# Patient Record
Sex: Male | Born: 2000 | Race: White | Hispanic: No | Smoking: Never smoker
Health system: Southern US, Community
[De-identification: ages and names within clinical notes are randomized; demographics above are authoritative.]

## PROBLEM LIST (undated history)

## (undated) DIAGNOSIS — J45909 Unspecified asthma, uncomplicated: Secondary | ICD-10-CM

## (undated) HISTORY — DX: Unspecified asthma, uncomplicated: J45.909

---
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
## 2021-02-19 ENCOUNTER — Encounter: Admit: 2021-02-19 | Payer: PRIVATE HEALTH INSURANCE | Attending: Pediatrics | Primary: Pediatrics

## 2021-02-19 ENCOUNTER — Telehealth: Admit: 2021-02-19 | Payer: PRIVATE HEALTH INSURANCE | Primary: Pediatrics

## 2021-02-19 DIAGNOSIS — Q5522 Retractile testis: Secondary | ICD-10-CM

## 2021-02-19 NOTE — Telephone Encounter
[facility] MESSAGETime of call:   11:33 AMCaller:   NickiCaller's relationship to patient:  n/a  Calling from [facility], hospital, agency, etc.):  Dr's office    Reason for call:   Clair Gulling called in on behalf of the patient ( From Dr Sherald Barge office)   Dr is looking for Brendolyn Patty to give a call to office to discuss   What would be a new  Patient  To the office  . Pt has Ascending testicle and is leaving to go back to school  On Friday. If not feeling well, what are symptoms:  n/a   If having symptoms, how long have the symptoms been present:  n/a   Does caller request to speak to someone urgently?  no   Best telephone number for callback:   [phone] Best time to return call:   any Permission to leave message:  no   [facility]

## 2021-02-19 NOTE — Telephone Encounter
Spoke with Dr. Sherald Barge office. Andrew Holder is not in pain but wants to be evaluated. Offered appointment tomorrow, which they accepted. Message to our scheduling team to touch base with the patient, though Dr. Sherald Barge office is also.

## 2021-02-20 ENCOUNTER — Inpatient Hospital Stay: Admit: 2021-02-20 | Discharge: 2021-02-20 | Payer: PRIVATE HEALTH INSURANCE | Primary: Pediatrics

## 2021-02-20 ENCOUNTER — Encounter: Admit: 2021-02-20 | Payer: PRIVATE HEALTH INSURANCE | Attending: Pediatrics | Primary: Pediatrics

## 2021-02-20 ENCOUNTER — Ambulatory Visit: Admit: 2021-02-20 | Payer: PRIVATE HEALTH INSURANCE | Attending: Pediatrics | Primary: Pediatrics

## 2021-02-20 DIAGNOSIS — N3644 Muscular disorders of urethra: Secondary | ICD-10-CM

## 2021-02-20 DIAGNOSIS — Q5522 Retractile testis: Secondary | ICD-10-CM

## 2021-02-20 DIAGNOSIS — N50811 Right testicular pain: Secondary | ICD-10-CM

## 2021-02-20 MED ORDER — IBUPROFEN 200 MG TABLET
200 mg | Freq: Four times a day (QID) | ORAL | Status: AC | PRN
Start: 2021-02-20 — End: ?

## 2021-02-20 MED ORDER — TAMSULOSIN 0.4 MG CAPSULE
0.4 mg | ORAL_CAPSULE | Freq: Every day | ORAL | 3 refills | Status: AC
Start: 2021-02-20 — End: ?

## 2021-02-20 NOTE — Progress Notes
Thank you for allowing me to offer my recommendations on Andrew Holder regarding his history of issues with his testicles.  Andrew Holder reports he has longstanding intermittent pain in his testicles.  He says that he has been evaluated multiple times for exacerbation and suspicion of torsion.  Torsion has always been ruled out.  He reports that he will sometimes have the pain when he is just sitting watching television.  He will sometimes have testicle pain if he is coughing hard he has also noted it when he vomits.  It does not seem to be related to any activity or correlated to urinating.  Of concern he became sexually active in August and has noted that his right testicle specifically will ascending to the inguinal area he has to push it into the scrotum this has happened 5 to 6 times.  He has no pain associated with these episodes but he is uncomfortable with it and does not like the way it feels.Intermittently he will have a start/stop urinary stream.  He reports at times he will have significant urgency to urinate but difficulty oral will be completely unable to start his flow.  This does seem exacerbated if he drinks alcohol.  There is no pain with urination.  There is no history of urinary tract infection. Bowel movements are reportedly daily stool is soft.  He has in the past had issues with diarrhea he was seen and evaluated by Pediatric Gastroenterology at [facility] and diagnosed with IBS.  They felt it was stress related.  . I had the pleasure of meeting Andrew Holder along with his parents as a new office consultation in our pediatric urologic faculty practice. I interviewed Andrew Holder's parents and reviewed the intake form with them which documents the full past medical, past surgical, family, and social history as well as the review of systems.  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  Objective:Temp 97.8 ?F (36.6 ?C) (Temporal)  - Ht 6' 4.46 (1.942 m)  - Wt 72.2 kg  - BMI 19.14 kg/m? Physical Exam:Appearance: Healthy appearing young man who is awake, alert and interactive.Head: Normocephalic atraumatic, eyes are anicteric, face is symmetric.Respiratory: Breathing comfortably and in no acute distress.Cardiac: Deferred.Abdomen: The abdomen is soft, non-tender, non distended and there are no palpable masses in all four quadrants.Genitalia: He is circumcised and the meatus is in the orthotopic location. He is Tanner stage 5. The testicles are Descended bilaterally in good position normal to palpation without pain or tenderness.  There is a right epididymal cyst noted on exam.. When in the standing position he had mild fullness in the right hemiscrotum.  I could not elicit a true hernia.Back: Spine is palpably normal and there are no sacral anomalies.Musculoskeletal: He is walking without difficulty, moving all four extremities and good muscle tone.Skin: The skin is warm, dry and evenly pigmented.Uroflow EMG today was done in the office today there is an interrupted plateau void.  Strains with his abdomen to begin his flow pelvic floor is quiet.  Small postvoid residual noted.Impression and plan:In summary, Andrew Holder is a 20 y.o. 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  We discussed that the uroflow EMG findings today are consistent with primary bladder neck dysfunction and have recommended he begin an alpha-blocker.  This was explained in great detail to he and his parents.In regards  to his exam the right testes is by history retract bill.  We discussed that it is unlikely there is a significant hernia but did order an ultrasound.  That ultrasound was done reviewed and found to be normal.  We have recommended a right septal pexy is done electively when he returns from college to address the retractile testes... Thank you again for allowing me to participate in Andrew Holder's care. Please do not hesitate to contact me with questions or concerns. Sincerely,Winfred Iiams Darolyn Rua, APRNParents/Guardian confirmed understanding of instructions and plan of care.

## 2021-02-27 ENCOUNTER — Telehealth: Admit: 2021-02-27 | Payer: PRIVATE HEALTH INSURANCE | Attending: Pediatric Urology | Primary: Pediatrics

## 2021-02-27 NOTE — Telephone Encounter
Spoke to mom and wants to wait until he returns from school and see doctor in May and then will consider calling back to book procedure if needs to

## 2021-03-03 ENCOUNTER — Emergency Department: Payer: 59

## 2021-03-03 ENCOUNTER — Emergency Department
Admission: EM | Admit: 2021-03-03 | Discharge: 2021-03-03 | Disposition: A | Discharge status: Home / Self Care | Payer: 59 | Attending: Emergency Medicine | Admitting: Emergency Medicine

## 2021-03-03 ENCOUNTER — Other Ambulatory Visit: Payer: Self-pay

## 2021-03-03 DIAGNOSIS — L539 Erythematous condition, unspecified: Secondary | ICD-10-CM | POA: Insufficient documentation

## 2021-03-03 DIAGNOSIS — Z20822 Contact with and (suspected) exposure to covid-19: Secondary | ICD-10-CM | POA: Insufficient documentation

## 2021-03-03 DIAGNOSIS — B349 Viral infection, unspecified: Secondary | ICD-10-CM

## 2021-03-03 DIAGNOSIS — R000 Tachycardia, unspecified: Secondary | ICD-10-CM | POA: Diagnosis not present

## 2021-03-03 DIAGNOSIS — R509 Fever, unspecified: Secondary | ICD-10-CM | POA: Diagnosis present

## 2021-03-03 LAB — POC SARS CORONAVIRUS 2 AG -  ED: SARS Coronavirus 2 Ag: NEGATIVE

## 2021-03-03 LAB — CBC
HCT: 44.9 % (ref 39.0–52.0)
Hemoglobin: 15.2 g/dL (ref 13.0–17.0)
MCH: 30.2 pg (ref 26.0–34.0)
MCHC: 33.9 g/dL (ref 30.0–36.0)
MCV: 89.3 fL (ref 80.0–100.0)
Platelets: 227 10*3/uL (ref 150–400)
RBC: 5.03 MIL/uL (ref 4.22–5.81)
RDW: 12.5 % (ref 11.5–15.5)
WBC: 9.9 10*3/uL (ref 4.0–10.5)
nRBC: 0 % (ref 0.0–0.2)

## 2021-03-03 LAB — RESP PANEL BY RT-PCR (FLU A&B, COVID) ARPGX2
Influenza A by PCR: NEGATIVE
Influenza B by PCR: NEGATIVE
SARS Coronavirus 2 by RT PCR: NEGATIVE

## 2021-03-03 LAB — HEPATIC FUNCTION PANEL
ALT: 14 U/L (ref 0–44)
AST: 24 U/L (ref 15–41)
Albumin: 4.7 g/dL (ref 3.5–5.0)
Alkaline Phosphatase: 61 U/L (ref 38–126)
Bilirubin, Direct: 0.2 mg/dL (ref 0.0–0.2)
Indirect Bilirubin: 0.9 mg/dL (ref 0.3–0.9)
Total Bilirubin: 1.1 mg/dL (ref 0.3–1.2)
Total Protein: 8.1 g/dL (ref 6.5–8.1)

## 2021-03-03 LAB — URINALYSIS, COMPLETE (UACMP) WITH MICROSCOPIC
Bacteria, UA: NONE SEEN
Bilirubin Urine: NEGATIVE
Glucose, UA: NEGATIVE mg/dL
Hgb urine dipstick: NEGATIVE
Ketones, ur: NEGATIVE mg/dL
Leukocytes,Ua: NEGATIVE
Nitrite: NEGATIVE
Protein, ur: NEGATIVE mg/dL
Specific Gravity, Urine: 1.02 (ref 1.005–1.030)
Squamous Epithelial / HPF: NONE SEEN (ref 0–5)
pH: 5 (ref 5.0–8.0)

## 2021-03-03 LAB — BASIC METABOLIC PANEL
Anion gap: 9 (ref 5–15)
BUN: 11 mg/dL (ref 6–20)
CO2: 22 mmol/L (ref 22–32)
Calcium: 9.1 mg/dL (ref 8.9–10.3)
Chloride: 101 mmol/L (ref 98–111)
Creatinine, Ser: 1.1 mg/dL (ref 0.61–1.24)
GFR, Estimated: >60 mL/min (ref >60)
Glucose, Bld: 118 mg/dL — ABNORMAL HIGH (ref 70–99)
Potassium: 4.4 mmol/L (ref 3.5–5.1)
Sodium: 132 mmol/L — ABNORMAL LOW (ref 135–145)

## 2021-03-03 LAB — MONONUCLEOSIS SCREEN: Mono Screen: NEGATIVE

## 2021-03-03 LAB — LACTIC ACID, PLASMA: Lactic Acid, Venous: 1 mmol/L (ref 0.5–1.9)

## 2021-03-03 LAB — GROUP A STREP BY PCR: Group A Strep by PCR: NOT DETECTED

## 2021-03-03 LAB — CBG MONITORING, ED: Glucose-Capillary: 107 mg/dL — ABNORMAL HIGH (ref 70–99)

## 2021-03-03 MED ORDER — IBUPROFEN 600 MG PO TABS
600.0000 mg | ORAL_TABLET | Freq: Once | ORAL | Status: AC
  Administered 2021-03-03: 600 mg via ORAL
  Filled 2021-03-03: qty 1

## 2021-03-03 MED ORDER — ACETAMINOPHEN 500 MG PO TABS
1000.0000 mg | ORAL_TABLET | Freq: Once | ORAL | Status: DC
  Filled 2021-03-03: qty 2

## 2021-03-03 MED ORDER — LACTATED RINGERS IV BOLUS
1000.0000 mL | Freq: Once | INTRAVENOUS | Status: AC
  Administered 2021-03-03: 1000 mL via INTRAVENOUS

## 2021-03-03 NOTE — ED Notes (Signed)
EDP @ bedside 

## 2021-03-03 NOTE — Discharge Instructions (Signed)
As we discussed, your work-up today was quite reassuring.  We did not identify a specific cause of your fever, but given your recent positive test for mononucleosis, you are most likely still suffering from symptoms of the mono.  Please continue to avoid strenuous athletic activity, drink plenty of fluids, use over-the-counter ibuprofen according to label instructions for pain or fever.  It is important you avoid any contact sports as it can lead to splenic rupture.  Follow-up with [facility] student health or with your own doctor at the next available opportunity.  Return to the emergency department if you develop new or worsening symptoms that concern you.

## 2021-03-03 NOTE — ED Notes (Signed)
ED Provider at bedside. 

## 2021-03-03 NOTE — ED Notes (Signed)
Patient given snack and water.

## 2021-03-03 NOTE — ED Notes (Signed)
Informed patient that per EDP PCP needs to request patient's chart after patient asked if his chart could be forwarded to his PCP.

## 2021-03-03 NOTE — ED Triage Notes (Signed)
Pt arrives to ED from home with c/c of dizziness and fever beginning at approx 11AM yesterday. Pt states he was seen 2 weeks ago by his primary care and told he had mono. Pt was given azithromycin and and albuterol inhaler. Pt A&Ox4, NAD, ambulatory. Pt states he is nauseated but denies vomiting or diarrhea.

## 2021-03-03 NOTE — ED Notes (Signed)
EDP informed that patient and parents are requesting to see him.

## 2021-03-03 NOTE — ED Notes (Signed)
Patient in stretcher on phone with parents.  Parents requested update, patient provided permission to do so. Parents updated that we are awaiting test results before a plan is developed for patient. Parents and patient state understanding.

## 2021-03-03 NOTE — ED Provider Notes (Signed)
[facility] Emergency Department Provider Note  ____________________________________________   Event Date/Time   First MD Initiated Contact with Patient 03/03/21 [id number]     (approximate)  I have reviewed the triage vital signs and the nursing notes.   HISTORY  Chief Complaint Dizziness and Fever    HPI Steve Myers is a 20 y.o. male who is generally healthy and athletic with no chronic illnesses but who was diagnosed a couple weeks ago with mononucleosis.  He presents today for evaluation of fever and lightheadedness/dizziness.  He says that he really did not have any symptoms but when he went home for spring break a couple of weeks ago he was told he had mono, and his doctor told him that she thinks he had had mono for 4 to 6 weeks.  He is not certain how she knew this or why she would say that long because he has not had symptoms for that long.  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  However he said that this morning he went for a hike which he admits he probably should not have done.  When he got back he was extremely tired and lightheaded and dizzy.  He felt little bit nauseated earlier today but no longer.  When he woke up from a nap he felt hot and checked his temperature and found that he had a fever of around 102.  He came into the ED for additional evaluation.  He also reports that he has a sore throat but that that has only really started within the last 24 hours.  He is not having any difficulty speaking or swallowing.  He has no neck pain or stiffness.  No swelling of the neck or the throat of which he is aware.  He denies chest pain, shortness of breath, cough, vomiting, abdominal pain, and dysuria.  He describes his symptoms as moderate and nothing in particular seems to make it better or worse.         History reviewed. No pertinent past medical  history.  There are no problems to display for this patient.   History reviewed. No pertinent surgical history.  Prior to Admission medications   Not on File    Allergies Patient has no allergy information on record.  History reviewed. No pertinent family history.  Social History Social History   Tobacco Use  . Smoking status: Never Smoker  . Smokeless tobacco: Never Used  Vaping Use  . Vaping Use: Former  Substance Use Topics  . Alcohol use: Yes  . Drug use: Yes    Types: Marijuana    Review of Systems Constitutional: Positive for fever. Eyes: No visual changes. ENT: Positive for sore throat. Cardiovascular: Denies chest pain.  Lightheaded and dizzy earlier today. Respiratory: Denies shortness of breath.  Denies cough. Gastrointestinal: No abdominal pain.  Nausea earlier but now resolved, no vomiting.  No diarrhea.  No constipation. Genitourinary: Negative for dysuria. Musculoskeletal: Negative for neck pain.  Negative for back pain. Integumentary: Negative for rash. Neurological: Negative for headaches, focal weakness or numbness.   ____________________________________________   PHYSICAL EXAM:  VITAL SIGNS: ED Triage Vitals  Enc Vitals Group     BP 03/03/21 0102 119/74     Pulse Rate 03/03/21 0102 (!) 106     Resp 03/03/21 0102 16     Temp 03/03/21 0102 (!) 101.1 F (  38.4 C)     Temp Source 03/03/21 0102 Oral     SpO2 03/03/21 0102 98 %     Weight 03/03/21 0103 73.5 kg (162 lb)     Height 03/03/21 0103 1.956 m (6\' 5" )     Head Circumference --      Peak Flow --      Pain Score 03/03/21 0102 6     Pain Loc --      Pain Edu? --      Excl. in GC? --     Constitutional: Alert and oriented.  Well-appearing and in no distress. Eyes: Conjunctivae are normal.  Head: Atraumatic. Nose: No congestion/rhinnorhea. Mouth/Throat: Oropharynx is notable for some erythema in the posterior pharynx and a little bit of exudate which is most notable on the right  tonsil but there is no significant tonsillar hypertrophy or edema in the back of the throat.  No evidence of dental abscess or Ludwig's angina.  No evidence of peritonsillar abscess. Neck: No stridor.  No meningeal signs.  No tenderness to palpation submandibularly nor tenderness to external manipulation of the larynx. Cardiovascular: Mild tachycardia, regular rhythm. Good peripheral circulation. Respiratory: Normal respiratory effort.  No retractions. Gastrointestinal: Soft and nontender. No distention.  Musculoskeletal: No lower extremity tenderness nor edema. No gross deformities of extremities. Neurologic:  Normal speech and language. No gross focal neurologic deficits are appreciated.  Skin:  Skin is warm, dry and intact. Psychiatric: Mood and affect are normal. Speech and behavior are normal.  ____________________________________________   LABS (all labs ordered are listed, but only abnormal results are displayed)  Labs Reviewed  BASIC METABOLIC PANEL - Abnormal; Notable for the following components:      Result Value   Sodium 132 (*)    Glucose, Bld 118 (*)    All other components within normal limits  URINALYSIS, COMPLETE (UACMP) WITH MICROSCOPIC - Abnormal; Notable for the following components:   Color, Urine YELLOW (*)    APPearance CLEAR (*)    All other components within normal limits  CBG MONITORING, ED - Abnormal; Notable for the following components:   Glucose-Capillary 107 (*)    All other components within normal limits  RESP PANEL BY RT-PCR (FLU A&B, COVID) ARPGX2  CULTURE, BLOOD (SINGLE)  GROUP A STREP BY PCR  CBC  MONONUCLEOSIS SCREEN  LACTIC ACID, PLASMA  HEPATIC FUNCTION PANEL  POC SARS CORONAVIRUS 2 AG -  ED   ____________________________________________  EKG  ED ECG REPORT I, 03/05/21, the attending physician, personally viewed and interpreted this ECG.  Date: 03/03/2021 EKG Time: 1:04 AM Rate: 105 Rhythm: Mild sinus tachycardia QRS Axis:  Right axis deviation Intervals: normal ST/T Wave abnormalities: Non-specific ST segment / T-wave changes, but no clear evidence of acute ischemia. Narrative Interpretation: no definitive evidence of acute ischemia; does not meet STEMI criteria.   ____________________________________________  RADIOLOGY I, 03/05/2021, personally viewed and evaluated these images (plain radiographs) as part of my medical decision making, as well as reviewing the written report by the radiologist.  ED MD interpretation: No acute abnormalities on chest x-ray  Official radiology report(s): DG Chest Port 1 View  Result Date: 03/03/2021 CLINICAL DATA:  Possible sepsis EXAM: PORTABLE CHEST 1 VIEW COMPARISON:  None. FINDINGS: Cardiac shadow is within normal limits. The lungs are well aerated bilaterally. No focal infiltrate or sizable effusion is seen. No bony abnormality is noted. IMPRESSION: No active disease. Electronically Signed   By: 03/05/2021 M.D.   On: 03/03/2021  02:39    ____________________________________________   PROCEDURES   Procedure(s) performed (including Critical Care):  Procedures   ____________________________________________   INITIAL IMPRESSION / MDM / ASSESSMENT AND PLAN / ED COURSE  As part of my medical decision making, I reviewed the following data within the electronic MEDICAL RECORD NUMBER History obtained from family, Nursing notes reviewed and incorporated, Labs reviewed , Old chart reviewed, Radiograph reviewed  and Notes from prior ED visits   Differential diagnosis includes, but is not limited to, mononucleosis, influenza, COVID-19, pneumonia, UTI, peritonsillar abscess, strep throat.  The patient is on the cardiac monitor to evaluate for evidence of arrhythmia and/or significant heart rate changes.  Patient may be somewhat volume depleted given that he seems to have an acute illness and went hiking today.  I am giving him LR 1 L IV bolus.  Chest x-ray and labs are  pending.  15-minute Covid rapid antigen test was negative and I am sending the viral respiratory panel that should be back in 2 hours that will include influenza testing.  Chest x-ray pending, lactic acid pending.  Basic metabolic panel is essentially normal, CBC is normal with no leukocytosis.  Urinalysis is negative for any sign of infection.  Hepatic function panel is pending as is mononucleosis screen.  I sent 1 blood culture which is pending.  Patient's heart rate is improved down to about 90.       Clinical Course as of 03/03/21 0757  Wynelle Link Mar 03, 2021  0313 [facility] Chest Kief 1 View I personally reviewed the patient's imaging and agree with the radiologist's interpretation that there is no evidence of any acute intrathoracic abnormality. [CF]  0344 Lactic Acid, Venous: 1.0 [CF]  0428 SARS Coronavirus 2 by RT PCR: NEGATIVE [CF]  0428 Influenza A By PCR: NEGATIVE [CF]  0428 Influenza B By PCR: NEGATIVE [CF]  0428 Mono Screen: NEGATIVE I suspect the negative results on the mono screen reflects the duration of the symptoms.  If he tested positive for mononucleosis 2 weeks ago, it is likely that the Monospot would be negative tonight even though he still remains somewhat symptomatic.  Strep test pending.  I updated the patient with his results and I will let him know about the strep results once they are back.  He is comfortable with the plan for discharge and outpatient follow-up given all of his other reassuring results. [CF]  0555 Strep test is negative.  I updated the patient.  He feels well and is ready to go.  With his permission and encouragement I spoke over the phone with his mother and gave her a full update.  She is also comfortable with the results.  I will discharge for outpatient follow-up at [facility] but the plan is also for the patient to go home for a while while he recuperates and I think that is appropriate as well.  I gave my usual and customary return precautions. [CF]     Clinical Course User Index [CF] Loleta Rose, MD     ____________________________________________  FINAL CLINICAL IMPRESSION(S) / ED DIAGNOSES  Final diagnoses:  Viral illness  Fever, unspecified fever cause     MEDICATIONS GIVEN DURING THIS VISIT:  Medications  lactated ringers bolus 1,000 mL (0 mLs Intravenous Stopped 03/03/21 0347)  ibuprofen (ADVIL) tablet 600 mg (600 mg Oral Given 03/03/21 0346)     ED Discharge Orders    None      *Please note:  Steve Myers was evaluated in  Emergency Department on 03/03/2021 for the symptoms described in the history of present illness. He was evaluated in the context of the global COVID-19 pandemic, which necessitated consideration that the patient might be at risk for infection with the SARS-CoV-2 virus that causes COVID-19. Institutional protocols and algorithms that pertain to the evaluation of patients at risk for COVID-19 are in a state of rapid change based on information released by regulatory bodies including the CDC and federal and state organizations. These policies and algorithms were followed during the patient's care in the ED.  Some ED evaluations and interventions may be delayed as a result of limited staffing during and after the pandemic.*  Note:  This document was prepared using Dragon voice recognition software and may include unintentional dictation errors.   Loleta RoseForbach, Cory, MD 03/03/21 [phone]

## 2021-03-03 NOTE — ED Notes (Addendum)
Patients parents calling this RN on ascom for update. Update had been previously given at time of assessment, no changes in patient since last notified. Patient and parents informed that EDP will evaluate patient as soon as possible, patients father increasingly frustrated with this RN, raising voice over phone at this RN. RN attempted to inform parents that patient is in NAD, and that EDP will be in to see patient. Parents state understanding and are concerned with patient not being able to get ride home.   Patient in NAD. Permission given from patient to update parents.

## 2021-03-08 LAB — CULTURE, BLOOD (SINGLE)
Culture: NO GROWTH
Special Requests: ADEQUATE

## 2022-07-30 ENCOUNTER — Encounter: Payer: Self-pay | Admitting: Adult Health

## 2022-07-30 ENCOUNTER — Ambulatory Visit (INDEPENDENT_AMBULATORY_CARE_PROVIDER_SITE_OTHER): Payer: 59 | Admitting: Adult Health

## 2022-07-30 ENCOUNTER — Other Ambulatory Visit: Payer: Self-pay

## 2022-07-30 VITALS — BP 114/70 | HR 95 | Temp 98.3°F | Ht 77.0 in | Wt 165.0 lb

## 2022-07-30 DIAGNOSIS — M25561 Pain in right knee: Secondary | ICD-10-CM | POA: Diagnosis not present

## 2022-07-30 NOTE — Progress Notes (Signed)
[facility] [address] [place], [place] [address] Phone: [phone] Fax: [phone]   Office Visit Note  Patient Name: Steve Myers [facility]  Date of Birth:24-Apr-2001  Med Rec number [id number]  Date of Service: 07/30/2022  Steve Myers (malabar nut tree) [justicia adhatoda] and Peanut-containing drug products  Chief Complaint  Patient presents with   Pain    In Right leg     HPI  Patient reports he was working out at [employer], doing leg press.  He had instant pain after doing leg press (360lbs). Later in the shower he noticed something was "out of place on his posterior right knee, he "pushed it back in".  This happened twice, and today he is sore, and has some anterior knee pain.  He took 6 advil and 2 tylenol, with some relief.   Current Medication:  Outpatient Encounter Medications as of 07/30/2022  Medication Sig   albuterol (VENTOLIN HFA) 108 (90 Base) MCG/ACT inhaler Inhale into the lungs every 6 (six) hours as needed for wheezing or shortness of breath.   cetirizine (ZYRTEC) 10 MG tablet Take 10 mg by mouth daily.   No facility-administered encounter medications on file as of 07/30/2022.      Medical History: History reviewed. No pertinent past medical history.   Vital Signs: BP 114/70 (BP Location: Right Arm, Patient Position: Sitting)   Pulse 95   Temp 98.3 F (36.8 C) (Tympanic)   Ht 6\' 5"  (1.956 m)   Wt 165 lb (74.8 kg)   SpO2 98%   BMI 19.57 kg/m    Review of Systems  Constitutional:  Negative for fatigue and fever.  Musculoskeletal:        Right knee pain    Physical Exam Constitutional:      Appearance: Normal appearance.  Musculoskeletal:     Comments: Right posterior medial knee pain  Neurological:     Mental Status: He is alert.    Assessment/Plan: 1. Knee pain, right anterior Advil/Motrin. Discussed R.I.C.E.  Rest, Ice 20 minutes on, with at least two hours of off time between icings.  Use a barrier between  skin and ice.  Take Ibuprofen 600-800mg  ever 6-8 hours for inflammation.  Compression with Ace-Wrap or brace.  Keep elevated whenever possible.     2. Posterior knee pain, right Discussed R.I.C.E.  Rest, Ice 20 minutes on, with at least two hours of off time between icings.  Use a barrier between skin and ice.  Take Ibuprofen 600-800mg  ever 6-8 hours for inflammation.  Compression with Ace-Wrap or brace.  Keep elevated whenever possible.    Follow up via MyChart messenger if symptoms fail to improve or may return to clinic as needed for worsening symptoms.      General Counseling: Steve Myers verbalizes understanding of the findings of todays visit and agrees with plan of treatment. I have discussed any further diagnostic evaluation that may be needed or ordered today. We also reviewed his medications today. he has been encouraged to call the office with any questions or concerns that should arise related to todays visit.   No orders of the defined types were placed in this encounter.   No orders of the defined types were placed in this encounter.   Time spent:25 Minutes    Karleen Hampshire AGNP-C Nurse Practitioner

## 2022-08-06 ENCOUNTER — Ambulatory Visit (INDEPENDENT_AMBULATORY_CARE_PROVIDER_SITE_OTHER): Payer: 59 | Admitting: Family Medicine

## 2022-08-06 ENCOUNTER — Encounter: Payer: Self-pay | Admitting: Family Medicine

## 2022-08-06 VITALS — BP 114/70 | HR 91 | Temp 98.4°F | Ht 77.0 in | Wt 174.0 lb

## 2022-08-06 DIAGNOSIS — S0992XA Unspecified injury of nose, initial encounter: Secondary | ICD-10-CM

## 2022-08-06 DIAGNOSIS — J4520 Mild intermittent asthma, uncomplicated: Secondary | ICD-10-CM

## 2022-08-06 DIAGNOSIS — J069 Acute upper respiratory infection, unspecified: Secondary | ICD-10-CM | POA: Diagnosis not present

## 2022-08-06 NOTE — Progress Notes (Signed)
[facility] [address] [place], [place] [address] Phone: [phone] Fax: [phone]   Office Visit Note  Patient Name: Steve Myers [facility]  Date of Birth:June 27, 2001  Med Rec number [id number]  Date of Service: 08/06/2022  Bevelyn Buckles (malabar nut tree) [justicia adhatoda] and Peanut-containing drug products  Chief Complaint  Patient presents with   Facial Injury     Took a shower two nights ago and right finger went inside his nose - bled immediately - really hurt, now can't breathe out of the right side of nose Feels like there is a "step" in his nose Nose hurts and he is congested Concerned he has broken his nose  Asthma has also flared over the past few days - used his albuterol yesterday but not today  Facial Injury         Current Medication:  Outpatient Encounter Medications as of 08/06/2022  Medication Sig   albuterol (VENTOLIN HFA) 108 (90 Base) MCG/ACT inhaler Inhale into the lungs every 6 (six) hours as needed for wheezing or shortness of breath.   cetirizine (ZYRTEC) 10 MG tablet Take 10 mg by mouth daily.   No facility-administered encounter medications on file as of 08/06/2022.      Medical History: No past medical history on file.   Vital Signs: There were no vitals taken for this visit.   Review of Systems  Constitutional: Negative.   HENT:  Positive for congestion and postnasal drip. Negative for sinus pressure, sinus pain and sore throat.   Respiratory:         Asthma flare    Physical Exam Constitutional:      Appearance: Normal appearance. He is normal weight.  HENT:     Right Ear: Hearing, tympanic membrane and ear canal normal.     Left Ear: Hearing, tympanic membrane and ear canal normal.     Nose: Septal deviation, nasal tenderness and congestion present. No nasal deformity.     Right Turbinates: Swollen.     Left Turbinates: Swollen.     Comments: Cloudy nasal discharge and inflamed  mucosa Cartilaginous tenderness only - no bony tenderness, no external bruisng or sweeling Pulmonary:     Effort: Pulmonary effort is normal.     Breath sounds: Normal breath sounds.  Neurological:     Mental Status: He is alert.  Psychiatric:        Mood and Affect: Mood is anxious.       Assessment/Plan:  1. Acute URI Congestion and sense of nasal obstruction is due to this Advised OTC cold medication with a decongestant. May also inhale steam with or without eucalyptus  2. Mild intermittent asthma, unspecified whether complicated Continue albuterol as needed - no evidence for lung infection at this time  3. Nasal injury, initial encounter No evidence of nasal fracture Expect cartilage to be sore for about a week more      General Counseling: Carlson verbalizes understanding of the findings of todays visit and agrees with plan of treatment. I have discussed any further diagnostic evaluation that may be needed or ordered today. We also reviewed his medications today. he has been encouraged to call the office with any questions or concerns that should arise related to todays visit.   No orders of the defined types were placed in this encounter.   No orders of the defined types were placed in this encounter.   Time spent:20 Minutes   Dr Durwin Reges Shiniqua [place] [facility] University Physician

## 2022-09-17 ENCOUNTER — Ambulatory Visit (INDEPENDENT_AMBULATORY_CARE_PROVIDER_SITE_OTHER): Payer: 59 | Admitting: Medical

## 2022-09-17 ENCOUNTER — Other Ambulatory Visit: Payer: Self-pay

## 2022-09-17 ENCOUNTER — Encounter: Payer: Self-pay | Admitting: Medical

## 2022-09-17 VITALS — BP 112/68 | HR 81 | Temp 97.3°F | Wt 164.0 lb

## 2022-09-17 DIAGNOSIS — J029 Acute pharyngitis, unspecified: Secondary | ICD-10-CM | POA: Diagnosis not present

## 2022-09-17 DIAGNOSIS — R590 Localized enlarged lymph nodes: Secondary | ICD-10-CM | POA: Diagnosis not present

## 2022-09-17 LAB — POC COVID19 BINAXNOW: SARS Coronavirus 2 Ag: NEGATIVE

## 2022-09-17 LAB — POCT RAPID STREP A (OFFICE): Rapid Strep A Screen: NEGATIVE

## 2022-09-17 NOTE — Patient Instructions (Signed)
-  Take an over-the-counter pain reliever/anti-inflammatory (such as ibuprofen 600 mg every 6 hours with food) to help relieve pain.   -Rest and drink plenty of water.  Drinking warm or cold liquids (such as tea, soup or smoothies) and eating soft foods (such as oatmeal) may be more comfortable until your throat pain improves.   -Do not share cups/water bottles/ utensils with others.  Wash your hands or use hand sanitizer often, especially before/after eating and after coughing into your hand or blowing your nose.    -You will receive a MyChart message notifying you of your lab results when they are available. -Send MyChart message to provider in meantime as needed for new/worsening symptoms (such as increased throat pain or difficulty swallowing) or with questions or concerns.  Will consider steroid if not bacterial (based on labs) and if Ibuprofen not adequately controlling pain.

## 2022-09-17 NOTE — Progress Notes (Signed)
[facility] [address] [place], [place] [address] Phone: [phone] Fax: [phone]   Office Visit Note  Patient Name: Steve Myers [facility]  Date of Birth:10/22/2001  Med Rec number [id number]  Date of Service: 09/17/2022  Allergies: Justicia adhatoda (malabar nut tree) [justicia adhatoda] and Peanut-containing drug products  Chief Complaint  Patient presents with   Sore Throat     HPI [name] presents with sore throat.  Sore throat began 1-2 days ago, especially bad yesterday. Has been painful to swallow and speak. Denies fever, chills or sweats. Mild nasal congestion and runny nose. Has had intermittent HA last couple days. Some achiness in shoulders. No cough. No nausea, vomiting or diarrhea. Some decreased appetite.  His girlfriend currently has mono, diagnosed about 10 days ago. He had mono ~2 years ago. Patient states they have been kissing despite her diagnosis. No known COVID exposures.  Has been taking Advil and Tylenol. No meds yet today.  Supposed to go to concert tonight. Then going home for fall break. Has not been getting sufficient rest recently. Smoked a few cigarettes a few days ago, does not do this normally.   Current Medication:  Outpatient Encounter Medications as of 09/17/2022  Medication Sig   albuterol (VENTOLIN HFA) 108 (90 Base) MCG/ACT inhaler Inhale into the lungs every 6 (six) hours as needed for wheezing or shortness of breath.   cetirizine (ZYRTEC) 10 MG tablet Take 10 mg by mouth daily.   No facility-administered encounter medications on file as of 09/17/2022.      Medical History: Past Medical History:  Diagnosis Date   Asthma    H/O infectious mononucleosis 2021     Vital Signs: BP 112/68   Pulse 81   Temp (!) 97.3 F (36.3 C) (Tympanic)   Wt 164 lb (74.4 kg)   SpO2 97%   BMI 19.45 kg/m    Review of Systems  Constitutional:  Positive for appetite change (decreased) and fatigue. Negative for chills  and fever.  HENT:  Positive for congestion, rhinorrhea and sore throat.   Respiratory:  Negative for cough.   Gastrointestinal:  Negative for diarrhea, nausea and vomiting.  Musculoskeletal:  Positive for myalgias.  Neurological:  Positive for headaches.    Physical Exam Vitals reviewed.  Constitutional:      General: He is not in acute distress.    Appearance: He is not ill-appearing.     Comments: Tired appearing  HENT:     Head: Normocephalic.     Right Ear: Tympanic membrane, ear canal and external ear normal.     Left Ear: Tympanic membrane, ear canal and external ear normal.     Nose: Mucosal edema (mild) present. No congestion or rhinorrhea.     Comments: Sl erythema to nasal mucosa. Small dry yellow mucus.    Mouth/Throat:     Mouth: Mucous membranes are moist. No oral lesions.     Pharynx: Posterior oropharyngeal erythema (mild) present. No pharyngeal swelling or uvula swelling.     Tonsils: 1+ on the right. 1+ on the left.     Comments: Tonsils without significant erythema, perhaps sl white exudate. Cardiovascular:     Rate and Rhythm: Normal rate and regular rhythm.     Heart sounds: No murmur heard.    No friction rub. No gallop.  Pulmonary:     Effort: Pulmonary effort is normal.     Breath sounds: Normal breath sounds. No wheezing, rhonchi or rales.  Musculoskeletal:  Cervical back: Neck supple. No rigidity.  Lymphadenopathy:     Cervical: Cervical adenopathy (1+ anterior nodes, mildly tender. Shotty posterior nodes, nontender.) present.  Neurological:     Mental Status: He is alert.     Results for orders placed or performed in visit on 09/17/22 (from the past 24 hour(s))  POC COVID-19     Status: Normal   Collection Time: 09/17/22 10:54 AM  Result Value Ref Range   SARS Coronavirus 2 Ag Negative Negative  POCT rapid strep A     Status: Normal   Collection Time: 09/17/22 10:54 AM  Result Value Ref Range   Rapid Strep A Screen Negative Negative      Assessment/Plan: 1. Pharyngitis, unspecified etiology 2. Cervical adenopathy Discussed possibility of EBV reactivation or symptoms resulting from immune response to EBV virus re-exposure. Offered blood work to better characterize illness, patient agreed. - POCT rapid strep A - POC COVID-19 - CBC with Differential/Platelet - EPSTEIN-BARR VIRUS (EBV) Antibody Profile   Patient Instructions:  -Take an over-the-counter pain reliever/anti-inflammatory (such as ibuprofen 600 mg every 6 hours with food) to help relieve pain.   -Rest and drink plenty of water.  Drinking warm or cold liquids (such as tea, soup or smoothies) and eating soft foods (such as oatmeal) may be more comfortable until your throat pain improves.   -Do not share cups/water bottles/ utensils with others.  Wash your hands or use hand sanitizer often, especially before/after eating and after coughing into your hand or blowing your nose.    -You will receive a MyChart message notifying you of your lab results when they are available. -Send MyChart message to provider in meantime as needed for new/worsening symptoms (such as increased throat pain or difficulty swallowing) or with questions or concerns.  Will consider steroid if not bacterial (based on labs) and if Ibuprofen not adequately controlling pain.  General Counseling: Steve Myers verbalizes understanding of the findings of todays visit and agrees with plan of treatment. I have discussed any further diagnostic evaluation that may be needed or ordered today.  he has been encouraged to call the office with any questions or concerns that should arise related to todays visit.   Orders Placed This Encounter  Procedures   CBC with Differential/Platelet   EPSTEIN-BARR VIRUS (EBV) Antibody Profile   POCT rapid strep A   POC COVID-19    No orders of the defined types were placed in this encounter.   Time spent:20 Sadler PA-C Dawson 09/17/2022 10:04 PM

## 2022-09-18 LAB — CBC WITH DIFFERENTIAL/PLATELET
Basophils Absolute: 0.1 10*3/uL (ref 0.0–0.2)
Basos: 1 %
EOS (ABSOLUTE): 0.1 10*3/uL (ref 0.0–0.4)
Eos: 1 %
Hematocrit: 46.5 % (ref 37.5–51.0)
Hemoglobin: 15.6 g/dL (ref 13.0–17.7)
Immature Grans (Abs): 0 10*3/uL (ref 0.0–0.1)
Immature Granulocytes: 0 %
Lymphocytes Absolute: 2.2 10*3/uL (ref 0.7–3.1)
Lymphs: 23 %
MCH: 29.9 pg (ref 26.6–33.0)
MCHC: 33.5 g/dL (ref 31.5–35.7)
MCV: 89 fL (ref 79–97)
Monocytes Absolute: 0.9 10*3/uL (ref 0.1–0.9)
Monocytes: 10 %
Neutrophils Absolute: 6.2 10*3/uL (ref 1.4–7.0)
Neutrophils: 65 %
Platelets: 251 10*3/uL (ref 150–450)
RBC: 5.21 x10E6/uL (ref 4.14–5.80)
RDW: 12.2 % (ref 11.6–15.4)
WBC: 9.3 10*3/uL (ref 3.4–10.8)

## 2022-09-18 LAB — EPSTEIN-BARR VIRUS (EBV) ANTIBODY PROFILE
EBV NA IgG: 136 U/mL — ABNORMAL HIGH (ref 0.0–17.9)
EBV VCA IgG: 95.1 U/mL — ABNORMAL HIGH (ref 0.0–17.9)
EBV VCA IgM: <36 U/mL (ref 0.0–35.9)

## 2022-09-19 ENCOUNTER — Telehealth: Payer: Self-pay | Admitting: Medical

## 2022-09-19 NOTE — Telephone Encounter (Signed)
Called and spoke to patient regarding lab results. Advised blood count looked normal, which is consistent with viral infection. EBV (mono) panel shows results consistent with previous mono/EBV infection but no evidence of current infection.  Patient states he still has sore throat but is feeling better. Encouraged continued rest, hydration and OTC meds as needed for symptoms.  He knows to register for MyChart and states he will reach out with message if needed if symptoms are not continuing to improve.

## 2022-10-01 ENCOUNTER — Other Ambulatory Visit: Payer: Self-pay

## 2022-10-01 ENCOUNTER — Emergency Department
Admission: EM | Admit: 2022-10-01 | Discharge: 2022-10-01 | Disposition: A | Discharge status: Home / Self Care | Payer: 59 | Attending: Emergency Medicine | Admitting: Emergency Medicine

## 2022-10-01 ENCOUNTER — Encounter: Payer: Self-pay | Admitting: Emergency Medicine

## 2022-10-01 DIAGNOSIS — J4520 Mild intermittent asthma, uncomplicated: Secondary | ICD-10-CM | POA: Diagnosis not present

## 2022-10-01 DIAGNOSIS — Z20822 Contact with and (suspected) exposure to covid-19: Secondary | ICD-10-CM | POA: Diagnosis not present

## 2022-10-01 DIAGNOSIS — R5383 Other fatigue: Secondary | ICD-10-CM | POA: Diagnosis present

## 2022-10-01 LAB — CBC WITH DIFFERENTIAL/PLATELET
Abs Immature Granulocytes: 0.01 10*3/uL (ref 0.00–0.07)
Basophils Absolute: 0.1 10*3/uL (ref 0.0–0.1)
Basophils Relative: 1 %
Eosinophils Absolute: 0.1 10*3/uL (ref 0.0–0.5)
Eosinophils Relative: 2 %
HCT: 43.3 % (ref 39.0–52.0)
Hemoglobin: 14.5 g/dL (ref 13.0–17.0)
Immature Granulocytes: 0 %
Lymphocytes Relative: 36 %
Lymphs Abs: 2.4 10*3/uL (ref 0.7–4.0)
MCH: 30.3 pg (ref 26.0–34.0)
MCHC: 33.5 g/dL (ref 30.0–36.0)
MCV: 90.4 fL (ref 80.0–100.0)
Monocytes Absolute: 0.6 10*3/uL (ref 0.1–1.0)
Monocytes Relative: 9 %
Neutro Abs: 3.5 10*3/uL (ref 1.7–7.7)
Neutrophils Relative %: 52 %
Platelets: 260 10*3/uL (ref 150–400)
RBC: 4.79 MIL/uL (ref 4.22–5.81)
RDW: 12.2 % (ref 11.5–15.5)
WBC: 6.7 10*3/uL (ref 4.0–10.5)
nRBC: 0 % (ref 0.0–0.2)

## 2022-10-01 LAB — BASIC METABOLIC PANEL
Anion gap: 7 (ref 5–15)
BUN: 14 mg/dL (ref 6–20)
CO2: 27 mmol/L (ref 22–32)
Calcium: 9.6 mg/dL (ref 8.9–10.3)
Chloride: 105 mmol/L (ref 98–111)
Creatinine, Ser: 0.94 mg/dL (ref 0.61–1.24)
GFR, Estimated: >60 mL/min (ref >60)
Glucose, Bld: 93 mg/dL (ref 70–99)
Potassium: 4.7 mmol/L (ref 3.5–5.1)
Sodium: 139 mmol/L (ref 135–145)

## 2022-10-01 LAB — CHLAMYDIA/NGC RT PCR (ARMC ONLY)
Chlamydia Tr: NOT DETECTED
N gonorrhoeae: NOT DETECTED

## 2022-10-01 LAB — URINALYSIS, ROUTINE W REFLEX MICROSCOPIC
Bilirubin Urine: NEGATIVE
Glucose, UA: NEGATIVE mg/dL
Hgb urine dipstick: NEGATIVE
Ketones, ur: NEGATIVE mg/dL
Leukocytes,Ua: NEGATIVE
Nitrite: NEGATIVE
Protein, ur: NEGATIVE mg/dL
Specific Gravity, Urine: 1.01 (ref 1.005–1.030)
pH: 7 (ref 5.0–8.0)

## 2022-10-01 LAB — BRAIN NATRIURETIC PEPTIDE: B Natriuretic Peptide: 3 pg/mL (ref 0.0–100.0)

## 2022-10-01 LAB — RESP PANEL BY RT-PCR (FLU A&B, COVID) ARPGX2
Influenza A by PCR: NEGATIVE
Influenza B by PCR: NEGATIVE
SARS Coronavirus 2 by RT PCR: NEGATIVE

## 2022-10-01 LAB — MONONUCLEOSIS SCREEN: Mono Screen: NEGATIVE

## 2022-10-01 MED ORDER — SODIUM CHLORIDE 0.9 % IV BOLUS
1000.0000 mL | Freq: Once | INTRAVENOUS | Status: AC
  Administered 2022-10-01: 1000 mL via INTRAVENOUS

## 2022-10-01 MED ORDER — ALBUTEROL SULFATE HFA 108 (90 BASE) MCG/ACT IN AERS: 2.0000 | INHALATION_SPRAY | Freq: Four times a day (QID) | RESPIRATORY_TRACT | 2 refills | Status: DC | PRN

## 2022-10-01 NOTE — ED Triage Notes (Signed)
C/O fever, chills, fatigue x 4 days.  AAOx3.  Skin warm and dry. NAD

## 2022-10-01 NOTE — Discharge Instructions (Signed)
Your labs, urine, and swabs are normal. Return for any new, worsening, or change in symptoms or other concerns. Please stay hydrated and get plenty of rest!

## 2022-10-01 NOTE — ED Provider Notes (Signed)
[facility] Provider Note    Event Date/Time   First MD Initiated Contact with Patient 10/01/22 1728     (approximate)   History   Fever   HPI  Steve Myers is a 21 y.o. male who presents today for evaluation of fatigue for the past 4 days.  Patient reports that he is currently doing fraternity rush and has not been sleeping much.  He is wondering if he has mono because he feels similar to when he had mono.  He also reports mild runny nose.  He denies chest pain or shortness of breath.  He reports that he is out of his inhaler because he left it at home in [place] and is requesting a refill of this today.  He currently denies shortness of breath.  He has not had any abdominal pain, nausea, vomiting, diarrhea.  He reports that he is sexually active with 1 male partner denies concern for sexually transmitted diseases, but would like to be tested "just in case."  Denies dysuria or penile discharge.  No fevers or chills.  No sore throat.  He reports that he thinks that he is dehydrated and would like an IV.  Patient Active Problem List   Diagnosis Date Noted   Nasal injury, initial encounter 08/06/2022   Mild intermittent asthma 08/06/2022   Acute URI 08/06/2022          Physical Exam   Triage Vital Signs: ED Triage Vitals  Enc Vitals Group     BP 10/01/22 1649 121/70     Pulse Rate 10/01/22 1649 65     Resp 10/01/22 1649 18     Temp 10/01/22 1649 98.3 F (36.8 C)     Temp Source 10/01/22 1649 Oral     SpO2 10/01/22 1649 98 %     Weight 10/01/22 1637 163 lb 12.8 oz (74.3 kg)     Height 10/01/22 1637 6\' 5"  (1.956 m)     Head Circumference --      Peak Flow --      Pain Score 10/01/22 1637 0     Pain Loc --      Pain Edu? --      Excl. in GC? --     Most recent vital signs: Vitals:   10/01/22 1649  BP: 121/70  Pulse: 65  Resp: 18  Temp: 98.3 F (36.8 C)  SpO2: 98%    Physical Exam Vitals and nursing note reviewed.   Constitutional:      General: Awake and alert. No acute distress.    Appearance: Normal appearance. The patient is normal weight.  HENT:     Head: Normocephalic and atraumatic.     Mouth: Mucous membranes are moist. Uvula midline.  No tonsillar exudate.  No soft palate fluctuance.  No trismus.  No voice change.  No sublingual swelling.  No tender cervical lymphadenopathy.  No nuchal rigidity Tms clear bilaterally Eyes:     General: PERRL. Normal EOMs        Right eye: No discharge.        Left eye: No discharge.     Conjunctiva/sclera: Conjunctivae normal.  Cardiovascular:     Rate and Rhythm: Normal rate and regular rhythm.     Pulses: Normal pulses.     Heart sounds: Normal heart sounds Pulmonary:     Effort: Pulmonary effort is normal. No respiratory distress.     Breath sounds: Normal breath sounds.  Abdominal:  Abdomen is soft. There is no abdominal tenderness. No rebound or guarding. No distention. Musculoskeletal:        General: No swelling. Normal range of motion.     Cervical back: Normal range of motion and neck supple.  Skin:    General: Skin is warm and dry.     Capillary Refill: Capillary refill takes less than 2 seconds.     Findings: No rash.  Neurological:     Mental Status: The patient is awake and alert.      ED Results / Procedures / Treatments   Labs (all labs ordered are listed, but only abnormal results are displayed) Labs Reviewed  URINALYSIS, ROUTINE W REFLEX MICROSCOPIC - Abnormal; Notable for the following components:      Result Value   Color, Urine YELLOW (*)    APPearance CLEAR (*)    All other components within normal limits  RESP PANEL BY RT-PCR (FLU A&B, COVID) ARPGX2  CHLAMYDIA/NGC RT PCR ([facility] ONLY)            MONONUCLEOSIS SCREEN  CBC WITH DIFFERENTIAL/PLATELET  BRAIN NATRIURETIC PEPTIDE  BASIC METABOLIC PANEL     EKG     RADIOLOGY     PROCEDURES:  Critical Care performed:   Procedures   MEDICATIONS ORDERED  IN ED: Medications  sodium chloride 0.9 % bolus 1,000 mL (1,000 mLs Intravenous New Bag/Given 10/01/22 1810)     IMPRESSION / MDM / ASSESSMENT AND PLAN / ED COURSE  I reviewed the triage vital signs and the nursing notes.   Differential diagnosis includes, but is not limited to, viral syndrome, dehydration, fatigue, electrolyte disarray, COVID, flu.  Patient is awake and alert, hemodynamically stable and afebrile.  He is well in appearance.  Labs obtained are overall quite reassuring.  Urinalysis is clean.  He does not wish to wait for the results of his gonorrhea and Chlamydia test given that he has no concern for sexually transmitted diseases.  COVID/flu/RSV test was negative.  Patient was given a liter of normal saline and felt significantly improved.  I do suspect an element of fatigue from his recent fraternity rushing and lack of sleep which likely lowered his immune system for viral infections which we discussed.  We discussed return precautions and the importance of close outpatient follow-up.  Patient understands and agrees with plan.  He was discharged in stable condition.  Patient's presentation is most consistent with acute complicated illness / injury requiring diagnostic workup.   FINAL CLINICAL IMPRESSION(S) / ED DIAGNOSES   Final diagnoses:  Other fatigue  Mild intermittent asthma without complication     Rx / DC Orders   ED Discharge Orders          Ordered    albuterol (VENTOLIN HFA) 108 (90 Base) MCG/ACT inhaler  Every 6 hours PRN        10/01/22 1905             Note:  This document was prepared using Dragon voice recognition software and may include unintentional dictation errors.   Emeline Gins 10/01/22 1952    Carrie Mew, MD 10/08/22 [phone]

## 2022-10-06 ENCOUNTER — Ambulatory Visit (INDEPENDENT_AMBULATORY_CARE_PROVIDER_SITE_OTHER): Payer: 59 | Admitting: Adult Health

## 2022-10-06 ENCOUNTER — Encounter: Payer: Self-pay | Admitting: Adult Health

## 2022-10-06 VITALS — HR 60 | Temp 96.6°F

## 2022-10-06 DIAGNOSIS — K1370 Unspecified lesions of oral mucosa: Secondary | ICD-10-CM

## 2022-10-06 NOTE — Progress Notes (Signed)
[address] [place], [place] [address] Phone: [phone] Fax: [phone]   Office Visit Note  Patient Name: Steve Myers [facility]  Date of QQIWL:[id number]  Med Rec number [id number]  Date of Service: 10/06/2022  Lenon Ahmadi (malabar nut tree) [justicia adhatoda] and Peanut-containing drug products  Chief Complaint  Patient presents with   Cough     Cough Pertinent negatives include no chest pain, ear pain, fever, rash or sore throat.    Patient reports 3-4 days of pain at the base of his tongue on the under side.  He thinks there is some swelling there.    Current Medication:  Outpatient Encounter Medications as of 10/06/2022  Medication Sig   albuterol (VENTOLIN HFA) 108 (90 Base) MCG/ACT inhaler Inhale into the lungs every 6 (six) hours as needed for wheezing or shortness of breath.   albuterol (VENTOLIN HFA) 108 (90 Base) MCG/ACT inhaler Inhale 2 puffs into the lungs every 6 (six) hours as needed for wheezing or shortness of breath.   cetirizine (ZYRTEC) 10 MG tablet Take 10 mg by mouth daily.   No facility-administered encounter medications on file as of 10/06/2022.      Medical History: Past Medical History:  Diagnosis Date   Asthma    H/O infectious mononucleosis 2021     Vital Signs: Pulse 60   Temp (!) 96.6 F (35.9 C) (Tympanic)   SpO2 98%    Review of Systems  Constitutional:  Negative for fatigue and fever.  HENT:  Negative for ear discharge, ear pain, sneezing and sore throat.   Eyes:  Negative for pain.  Respiratory:  Negative for cough.   Cardiovascular:  Negative for chest pain.  Gastrointestinal:  Negative for diarrhea, nausea and vomiting.  Skin:  Negative for rash.    Physical Exam Vitals and nursing note reviewed.  Constitutional:      Appearance: Normal appearance.  HENT:     Head: Normocephalic.     Right Ear: Tympanic membrane and ear canal normal.     Left Ear: Tympanic membrane and ear  canal normal.     Mouth/Throat:     Mouth: Mucous membranes are moist.     Comments: Inflamation under tongue, one small lesion Eyes:     Pupils: Pupils are equal, round, and reactive to light.  Cardiovascular:     Rate and Rhythm: Normal rate.  Pulmonary:     Effort: Pulmonary effort is normal.  Musculoskeletal:     Cervical back: Normal range of motion.  Neurological:     Mental Status: He is alert.    Assessment/Plan:    1. Oral lesion Will communicate with patient when results are available.  Offered lidocaine swish and spit for numbing prior to eating.  Pt declined at this time.  - Herpes simplex virus culture       General Counseling: Corbyn verbalizes understanding of the findings of todays visit and agrees with plan of treatment. I have discussed any further diagnostic evaluation that may be needed or ordered today. We also reviewed his medications today. he has been encouraged to call the office with any questions or concerns that should arise related to todays visit.   Orders Placed This Encounter  Procedures   Herpes simplex virus culture    No orders of the defined types were placed in this encounter.   Time spent:20 Minutes Time spent includes review of chart, medications, test results, and follow up plan with the patient.  Kendell Bane AGNP-C Nurse Practitioner

## 2022-10-09 ENCOUNTER — Telehealth: Payer: Self-pay

## 2022-10-09 LAB — HERPES SIMPLEX VIRUS CULTURE

## 2022-10-09 NOTE — Telephone Encounter (Signed)
Called and left a VM for student informing that recent labs were normal. Informed in message if student had any questions to call the clinic or message provider on my chart.

## 2022-10-15 ENCOUNTER — Ambulatory Visit (INDEPENDENT_AMBULATORY_CARE_PROVIDER_SITE_OTHER): Payer: 59 | Admitting: Adult Health

## 2022-10-15 ENCOUNTER — Encounter: Payer: Self-pay | Admitting: Adult Health

## 2022-10-15 VITALS — HR 87 | Temp 99.0°F

## 2022-10-15 DIAGNOSIS — T22031A Burn of unspecified degree of right upper arm, initial encounter: Secondary | ICD-10-CM

## 2022-10-15 DIAGNOSIS — T300 Burn of unspecified body region, unspecified degree: Secondary | ICD-10-CM

## 2022-10-15 DIAGNOSIS — T171XXA Foreign body in nostril, initial encounter: Secondary | ICD-10-CM | POA: Diagnosis not present

## 2022-10-15 DIAGNOSIS — T2007XA Burn of unspecified degree of neck, initial encounter: Secondary | ICD-10-CM
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
## 2022-10-26 IMAGING — DX DG CHEST 1V PORT
1 series · 2 of 2 positions shown · non-contrast
Comparison: None.

CLINICAL DATA: Possible sepsis

EXAM:
PORTABLE CHEST 1 VIEW

[Series 1: chest ap · 0.14mm/px · 2 of 2 slices shown]
[im 1/2]
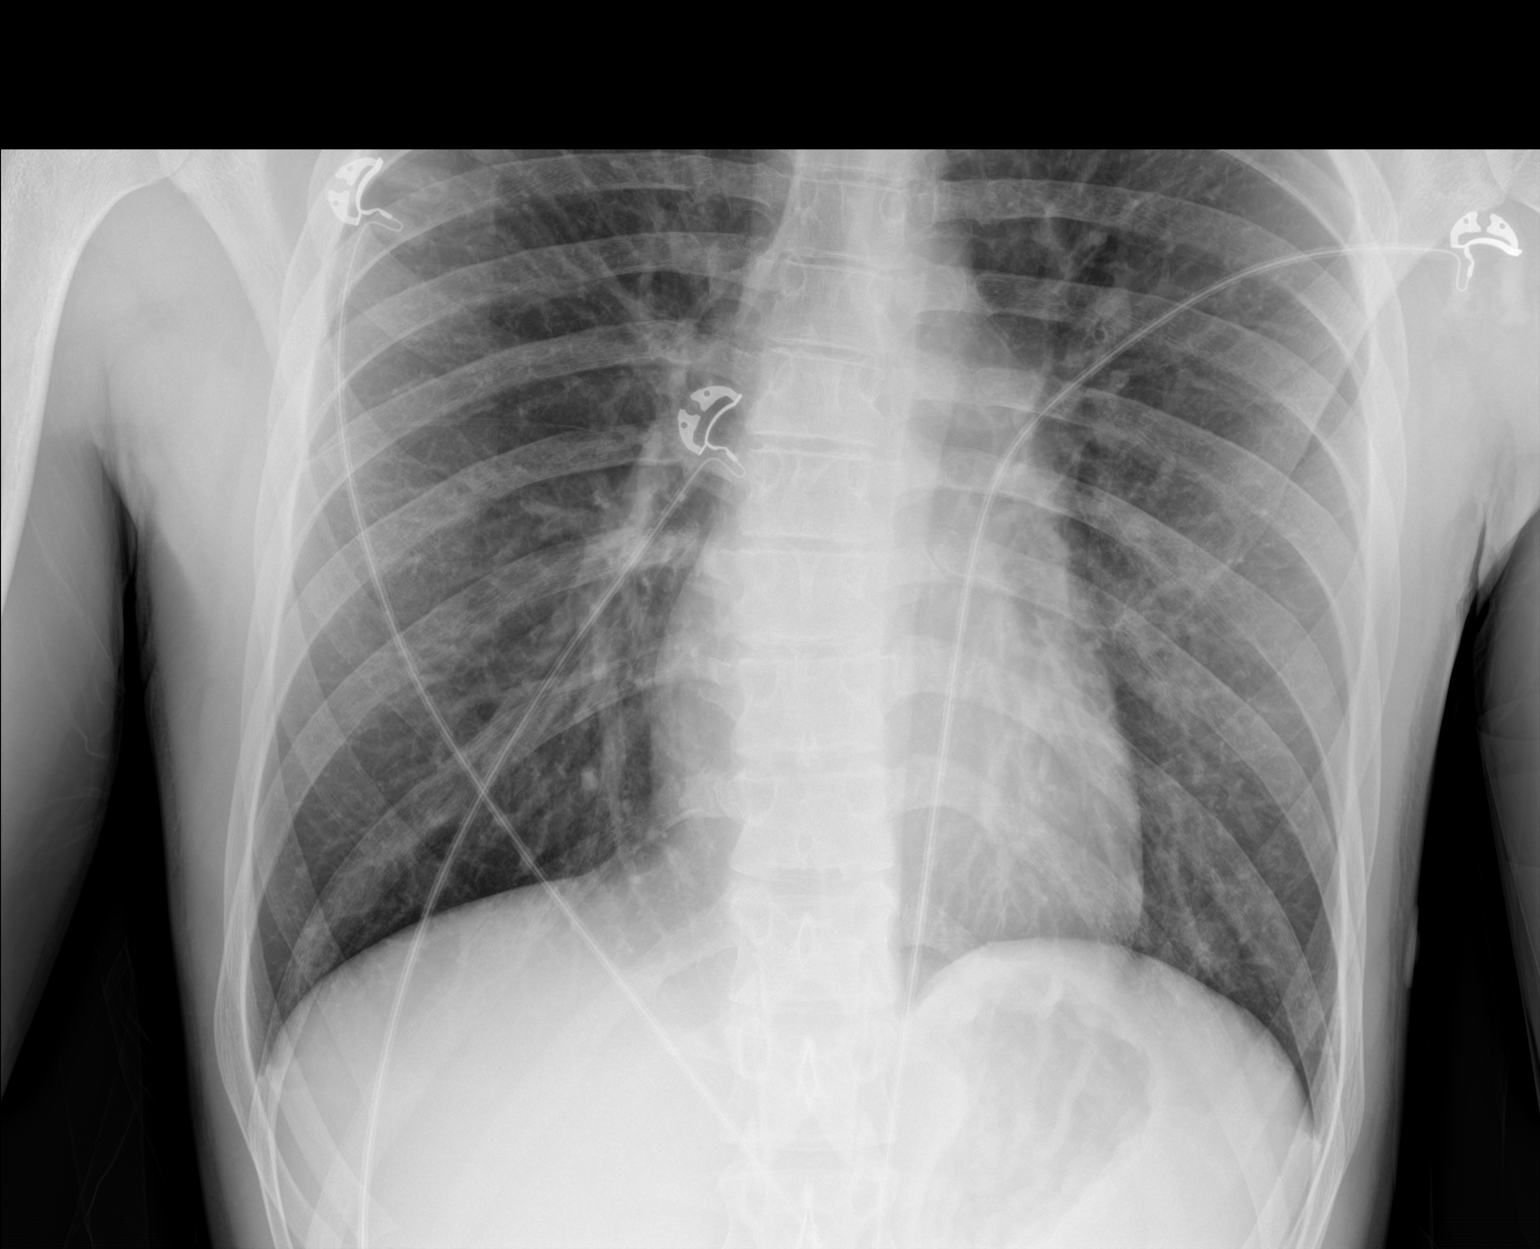
[im 2/2]
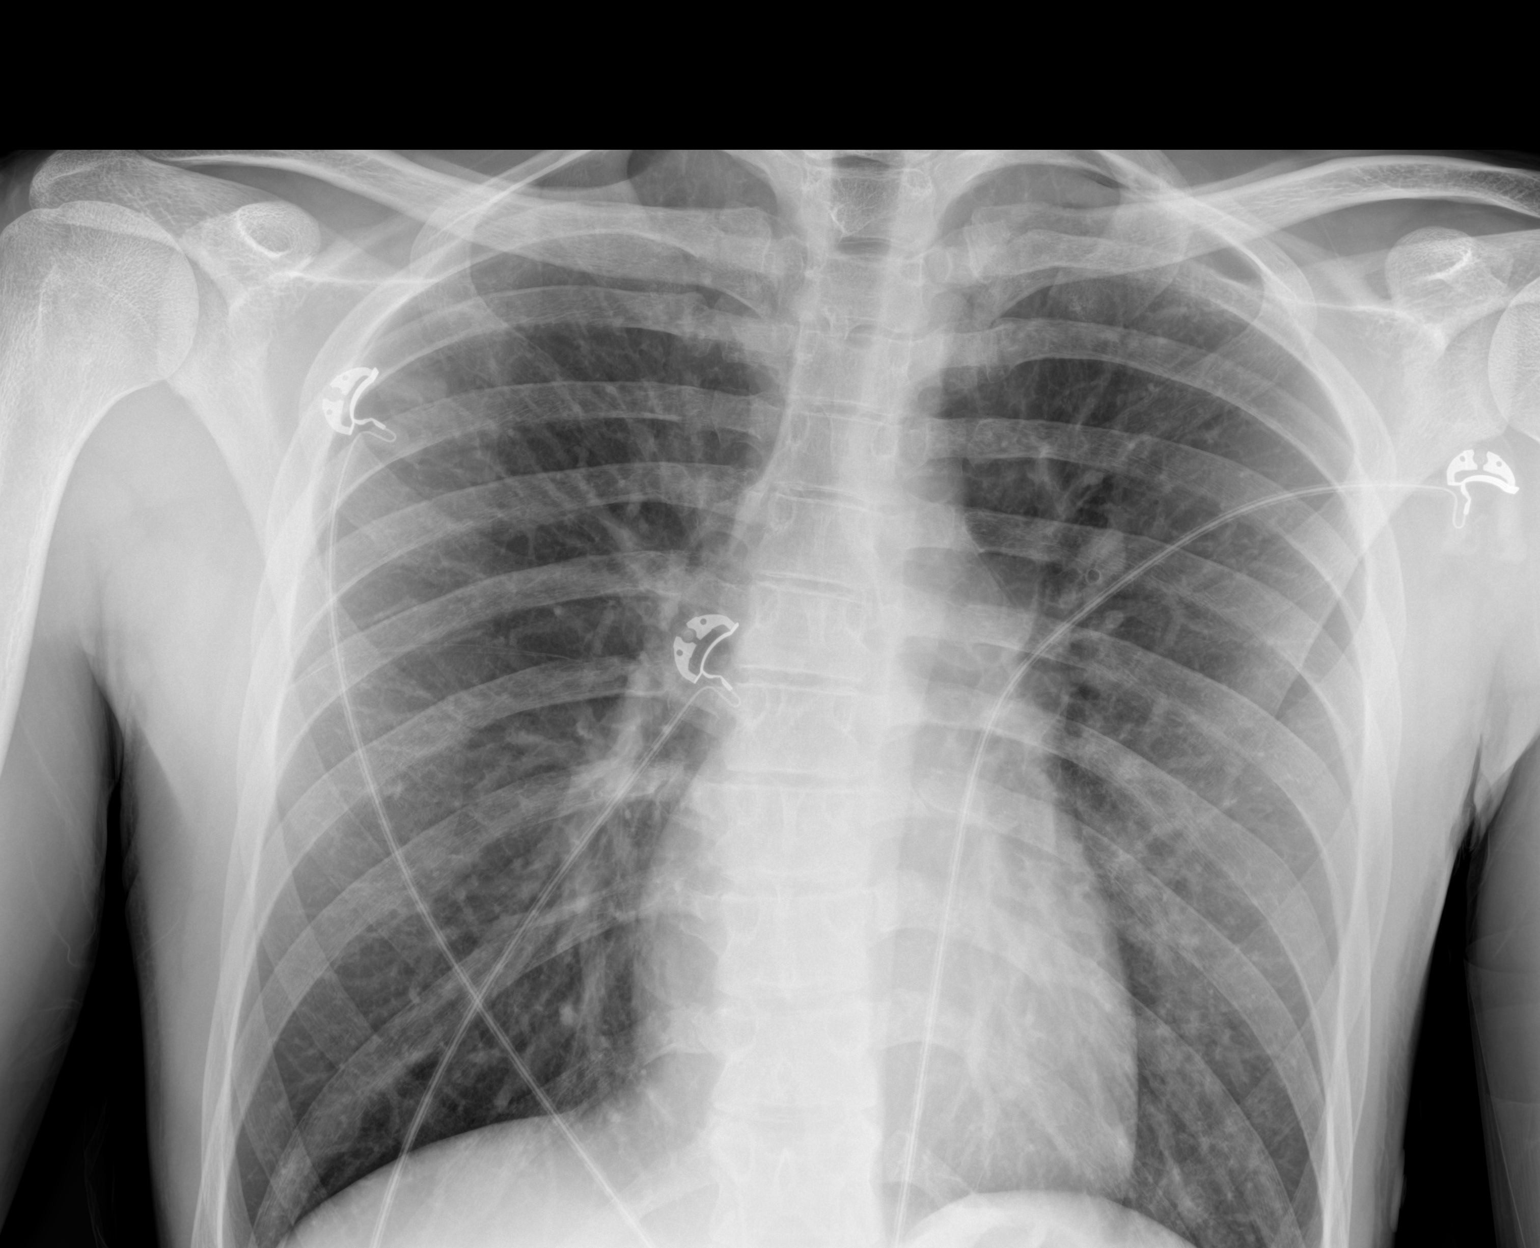

[2 of 2 positions shown; findings below may reference images not displayed]

FINDINGS: Cardiac shadow is within normal limits. The lungs are well aerated
bilaterally. No focal infiltrate or sizable effusion is seen. No
bony abnormality is noted.
IMPRESSION: No active disease.

## 2022-12-31 ENCOUNTER — Ambulatory Visit: Payer: 59 | Admitting: Medical

## 2023-04-06 ENCOUNTER — Other Ambulatory Visit: Payer: Self-pay

## 2023-04-06 ENCOUNTER — Ambulatory Visit (INDEPENDENT_AMBULATORY_CARE_PROVIDER_SITE_OTHER): Payer: 59 | Admitting: Family Medicine

## 2023-04-06 ENCOUNTER — Encounter: Payer: Self-pay | Admitting: Family Medicine

## 2023-04-06 VITALS — BP 119/68 | HR 71 | Temp 97.5°F | Wt 172.0 lb

## 2023-04-06 DIAGNOSIS — L089 Local infection of the skin and subcutaneous tissue, unspecified: Secondary | ICD-10-CM

## 2023-04-06 DIAGNOSIS — J4520 Mild intermittent asthma, uncomplicated: Secondary | ICD-10-CM | POA: Diagnosis not present

## 2023-04-06 MED ORDER — ALBUTEROL SULFATE HFA 108 (90 BASE) MCG/ACT IN AERS: 2.0000 | INHALATION_SPRAY | Freq: Four times a day (QID) | RESPIRATORY_TRACT | 2 refills | Status: DC | PRN

## 2023-04-06 MED ORDER — DOXYCYCLINE HYCLATE 100 MG PO TABS: 100.0000 mg | ORAL_TABLET | Freq: Two times a day (BID) | ORAL | 0 refills | Status: DC

## 2023-04-06 NOTE — Progress Notes (Signed)
[facility] [address] [place], [place] [address] Phone: [phone] Fax: [phone]   Office Visit Note  Patient Name: Steve Myers [facility]  Date of Birth:01/29/2001  Med Rec number [id number]  Date of Service: 04/06/2023  Steve Myers (malabar nut tree) [justicia adhatoda] and Peanut-containing drug products  Chief Complaint  Patient presents with   Injury     Right nipple pain since being at the gym yesterday Noticed it when leaning up against a pad - pressure on the chest wall  Injury      Current Medication:  Outpatient Encounter Medications as of 04/06/2023  Medication Sig   albuterol (VENTOLIN HFA) 108 (90 Base) MCG/ACT inhaler Inhale into the lungs every 6 (six) hours as needed for wheezing or shortness of breath.   albuterol (VENTOLIN HFA) 108 (90 Base) MCG/ACT inhaler Inhale 2 puffs into the lungs every 6 (six) hours as needed for wheezing or shortness of breath.   cetirizine (ZYRTEC) 10 MG tablet Take 10 mg by mouth daily.   No facility-administered encounter medications on file as of 04/06/2023.      Medical History: Past Medical History:  Diagnosis Date   Asthma    H/O infectious mononucleosis 2021     Vital Signs: There were no vitals taken for this visit.   Review of Systems  Respiratory:         Requests refill on his asthma inhaler    Physical Exam Vitals reviewed.  Constitutional:      Appearance: Normal appearance.  Chest:  Breasts:    Right: Mass, skin change and tenderness present.     Left: Normal.       Comments: Inflamed areola with swollen duct openings and erythema of upper outer section of areola  Chest hairs have been shaved Neurological:     Mental Status: He is alert.      Assessment/Plan:  1. Skin infection  - doxycycline (VIBRA-TABS) 100 MG tablet; Take 1 tablet (100 mg total) by mouth 2 (two) times daily.  Dispense: 14 tablet; Refill: 0  2. Mild intermittent asthma, unspecified  whether complicated  - albuterol (VENTOLIN HFA) 108 (90 Base) MCG/ACT inhaler; Inhale 2 puffs into the lungs every 6 (six) hours as needed for wheezing or shortness of breath.  Dispense: 8 g; Refill: 2      General Counseling: [name] verbalizes understanding of the findings of todays visit and agrees with plan of treatment. I have discussed any further diagnostic evaluation that may be needed or ordered today. We also reviewed his medications today. he has been encouraged to call the office with any questions or concerns that should arise related to todays visit.   No orders of the defined types were placed in this encounter.   No orders of the defined types were placed in this encounter.    Dr Durwin Reges Marquetta Weiskopf ABFM University Physician

## 2023-10-02 ENCOUNTER — Other Ambulatory Visit: Payer: Self-pay

## 2023-10-02 ENCOUNTER — Ambulatory Visit (INDEPENDENT_AMBULATORY_CARE_PROVIDER_SITE_OTHER): Payer: 59 | Admitting: Adult Health

## 2023-10-02 ENCOUNTER — Encounter: Payer: Self-pay | Admitting: Adult Health

## 2023-10-02 VITALS — BP 120/80 | HR 66 | Temp 96.9°F | Ht 77.0 in | Wt 180.0 lb

## 2023-10-02 DIAGNOSIS — Z113 Encounter for screening for infections with a predominantly sexual mode of transmission: Secondary | ICD-10-CM | POA: Diagnosis not present

## 2023-10-02 DIAGNOSIS — S8992XA Unspecified injury of left lower leg, initial encounter: Secondary | ICD-10-CM | POA: Diagnosis not present

## 2023-10-02 NOTE — Progress Notes (Signed)
[facility] [address] [place], [place] [address] Phone: [phone] Fax: [phone]   Office Visit Note  Patient Name: Steve Myers [facility]  Date of Birth:2001-09-22  Med Rec number [id number]  Date of Service: 10/02/2023  Bevelyn Buckles (malabar nut tree) [justicia adhatoda] and Peanut-containing drug products  Chief Complaint  Patient presents with   Knee Pain     Knee Pain    Patient reports 2 days ago he walked into the corner of a plastic table. He hit his left knee, and had excruciating pain.  He took Advil, with no improvement. He tried to come in yesterday but there were no appointments.  He went to urgent care, and they told him he had torn cartilage.  Patient also reports some mild, brief pain after urination, or ejaculation at the urethral opening.  He denies any discharge, redness, urinary frequency. Is currently sexually active with one male partner, that he has been with one month, and does not use condoms. Last testing was in July 2024, and he has had multiple partners since then, without condom use.    Current Medication:  Outpatient Encounter Medications as of 10/02/2023  Medication Sig   albuterol (VENTOLIN HFA) 108 (90 Base) MCG/ACT inhaler Inhale 2 puffs into the lungs every 6 (six) hours as needed for wheezing or shortness of breath.   cetirizine (ZYRTEC) 10 MG tablet Take 10 mg by mouth daily.   diclofenac (VOLTAREN) 75 MG EC tablet Take 75 mg by mouth 2 (two) times daily.   ibuprofen (ADVIL) 600 MG tablet Take 600 mg by mouth 2 (two) times daily.   [DISCONTINUED] doxycycline (VIBRA-TABS) 100 MG tablet Take 1 tablet (100 mg total) by mouth 2 (two) times daily.   No facility-administered encounter medications on file as of 10/02/2023.      Medical History: Past Medical History:  Diagnosis Date   Asthma    H/O infectious mononucleosis 2021     Vital Signs: BP 120/80   Pulse 66   Temp (!) 96.9 F (36.1 C)   Ht 6\' 5"   (1.956 m)   Wt 180 lb (81.6 kg)   SpO2 99%   BMI 21.34 kg/m    Review of Systems  Constitutional:  Negative for chills, fatigue and fever.  HENT:  Negative for congestion, ear pain, sinus pressure and sore throat.   Eyes:  Negative for pain and itching.  Respiratory:  Negative for cough.   Cardiovascular:  Negative for chest pain.  Gastrointestinal:  Negative for diarrhea, nausea and vomiting.  Genitourinary:  Positive for dysuria. Negative for flank pain, frequency, genital sores, penile discharge, penile swelling, scrotal swelling and testicular pain.  Musculoskeletal:  Positive for arthralgias.    Physical Exam Vitals reviewed.  Constitutional:      Appearance: Normal appearance.  HENT:     Head: Normocephalic.     Nose: Nose normal.  Eyes:     Pupils: Pupils are equal, round, and reactive to light.  Genitourinary:    Penis: Circumcised. Discharge present. No erythema, tenderness, swelling or lesions.      Testes: Normal.     Tanner stage (genital): 5.  Musculoskeletal:     Comments: Pain medially to left patella. No swelling, bruising or redness.   Lymphadenopathy:     Cervical: No cervical adenopathy.     Lower Body: No right inguinal adenopathy. No left inguinal adenopathy.  Neurological:     Mental Status: He is alert.     Assessment/Plan: 1. Injury  of left knee, initial encounter Take medications as prescribed by Urgent care.   2. Screening examination for venereal disease - Chlamydia/Gonococcus/Trichomonas, NAA - HIV Antibody (routine testing w rflx) - RPR Qual - Ct/GC NAA, Pharyngeal     General Counseling: Javarian verbalizes understanding of the findings of todays visit and agrees with plan of treatment. I have discussed any further diagnostic evaluation that may be needed or ordered today. We also reviewed his medications today. he has been encouraged to call the office with any questions or concerns that should arise related to todays  visit.   Orders Placed This Encounter  Procedures   Chlamydia/Gonococcus/Trichomonas, NAA   Ct/GC NAA, Pharyngeal   HIV Antibody (routine testing w rflx)   RPR Qual    No orders of the defined types were placed in this encounter.   Time spent:25 Minutes Time spent includes review of chart, medications, test results, and follow up plan with the patient.    Johnna Acosta AGNP-C Nurse Practitioner

## 2023-10-03 LAB — HIV ANTIBODY (ROUTINE TESTING W REFLEX): HIV Screen 4th Generation wRfx: NONREACTIVE

## 2023-10-03 LAB — RPR QUALITATIVE: RPR Ser Ql: NONREACTIVE

## 2023-10-05 ENCOUNTER — Encounter: Payer: Self-pay | Admitting: Adult Health

## 2023-10-06 ENCOUNTER — Encounter: Payer: Self-pay | Admitting: Adult Health

## 2023-10-06 ENCOUNTER — Other Ambulatory Visit: Payer: Self-pay | Admitting: Adult Health

## 2023-10-06 LAB — CT/GC NAA, PHARYNGEAL
C TRACH RRNA NPH QL PCR: NEGATIVE
N GONORRHOEA RRNA NPH QL PCR: NEGATIVE

## 2023-10-06 LAB — CHLAMYDIA/GONOCOCCUS/TRICHOMONAS, NAA
Chlamydia by NAA: POSITIVE — AB
Gonococcus by NAA: NEGATIVE
Trich vag by NAA: NEGATIVE

## 2023-10-06 MED ORDER — DOXYCYCLINE HYCLATE 100 MG PO TABS: 100.0000 mg | ORAL_TABLET | Freq: Two times a day (BID) | ORAL | 0 refills | Status: DC

## 2023-10-13 ENCOUNTER — Encounter: Payer: Self-pay | Admitting: Adult Health

## 2023-10-18 ENCOUNTER — Encounter: Payer: Self-pay | Admitting: Adult Health

## 2023-10-19 ENCOUNTER — Encounter: Payer: Self-pay | Admitting: Adult Health

## 2023-10-19 ENCOUNTER — Ambulatory Visit (INDEPENDENT_AMBULATORY_CARE_PROVIDER_SITE_OTHER): Payer: 59 | Admitting: Adult Health

## 2023-10-19 ENCOUNTER — Other Ambulatory Visit: Payer: Self-pay

## 2023-10-19 VITALS — HR 79 | Temp 95.9°F

## 2023-10-19 DIAGNOSIS — Z708 Other sex counseling: Secondary | ICD-10-CM

## 2023-10-19 NOTE — Progress Notes (Signed)
[facility] [address] [place], [place] [address] Phone: [phone] Fax: [phone]   Office Visit Note  Patient Name: Steve Myers [facility]  Date of Birth:10/24/2001  Med Rec number [id number]  Date of Service: 10/19/2023  Steve Myers (malabar nut tree) [justicia adhatoda] and Peanut-containing drug products  Chief Complaint  Patient presents with   Follow-up     HPI  Pt treated for chlamydia last week.  He reports one day after finishing antibiotics he had sex with his girlfriend, who was also being treated.  They both had completed their antibiotics.  He reports some itching of the skin of his penis.  Denies any redness, or discharge currently.  Current Medication:  Outpatient Encounter Medications as of 10/19/2023  Medication Sig   albuterol (VENTOLIN HFA) 108 (90 Base) MCG/ACT inhaler Inhale 2 puffs into the lungs every 6 (six) hours as needed for wheezing or shortness of breath.   cetirizine (ZYRTEC) 10 MG tablet Take 10 mg by mouth daily.   diclofenac (VOLTAREN) 75 MG EC tablet Take 75 mg by mouth 2 (two) times daily.   [DISCONTINUED] doxycycline (VIBRA-TABS) 100 MG tablet Take 1 tablet (100 mg total) by mouth 2 (two) times daily. (Patient not taking: Reported on 10/19/2023)   No facility-administered encounter medications on file as of 10/19/2023.      Medical History: Past Medical History:  Diagnosis Date   Asthma    H/O infectious mononucleosis 2021     Vital Signs: Pulse 79   Temp (!) 95.9 F (35.5 C) (Tympanic)   SpO2 97%    Review of Systems  Constitutional:  Negative for chills, fatigue and fever.  Genitourinary:  Negative for dysuria, penile discharge, penile pain, penile swelling, scrotal swelling and testicular pain.    Physical Exam Vitals and nursing note reviewed.  Genitourinary:    Penis: Normal and circumcised.      Testes: Normal.        Right: Mass, tenderness or swelling not present.        Left: Mass,  tenderness or swelling not present.     Assessment/Plan: 1. Sexually transmitted disease counseling Follow up in 2-3 weeks for retesting.      General Counseling: Steve Myers verbalizes understanding of the findings of todays visit and agrees with plan of treatment. I have discussed any further diagnostic evaluation that may be needed or ordered today. We also reviewed his medications today. he has been encouraged to call the office with any questions or concerns that should arise related to todays visit.   No orders of the defined types were placed in this encounter.   No orders of the defined types were placed in this encounter.   Time spent:15 Minutes Time spent includes review of chart, medications, test results, and follow up plan with the patient.    Steve Myers AGNP-C Nurse Practitioner

## 2023-10-21 ENCOUNTER — Encounter: Payer: Self-pay | Admitting: Adult Health

## 2023-10-23 ENCOUNTER — Other Ambulatory Visit: Payer: Self-pay | Admitting: Adult Health

## 2023-10-23 MED ORDER — FLUCONAZOLE 150 MG PO TABS: ORAL_TABLET | ORAL | 0 refills | Status: DC

## 2023-10-27 ENCOUNTER — Encounter: Payer: Self-pay | Admitting: Adult Health

## 2023-10-27 ENCOUNTER — Ambulatory Visit: Payer: 59 | Admitting: Adult Health

## 2023-10-27 VITALS — HR 101 | Temp 97.6°F

## 2023-10-27 DIAGNOSIS — N418 Other inflammatory diseases of prostate: Secondary | ICD-10-CM | POA: Diagnosis not present

## 2023-10-27 MED ORDER — DOXYCYCLINE HYCLATE 100 MG PO TABS: 100.0000 mg | ORAL_TABLET | Freq: Two times a day (BID) | ORAL | 0 refills | Status: DC

## 2023-10-27 NOTE — Progress Notes (Unsigned)
[facility] [address] [place], [place] [address] Phone: [phone] Fax: [phone]   Office Visit Note  Patient Name: Steve Myers [facility]  Date of Birth:2000/12/26  Med Rec number [id number]  Date of Service: 10/27/2023  Bevelyn Buckles (malabar nut tree) [justicia adhatoda] and Peanut-containing drug products  Chief Complaint  Patient presents with   Acute Visit     HPI  Patient reports his girlfriend has been inserting her finger in his rectum.  She has been doing this intermittently for a few weeks. He is not having some discomfort in his perineum. Denies any bleeding or difficulty with bowel movements. No fever/chills. Denies difficulty with urination/ejaculation.   Current Medication:  Outpatient Encounter Medications as of 10/27/2023  Medication Sig   cetirizine (ZYRTEC) 10 MG tablet Take 10 mg by mouth daily.   doxycycline (VIBRA-TABS) 100 MG tablet Take 1 tablet (100 mg total) by mouth 2 (two) times daily.   albuterol (VENTOLIN HFA) 108 (90 Base) MCG/ACT inhaler Inhale 2 puffs into the lungs every 6 (six) hours as needed for wheezing or shortness of breath. (Patient not taking: Reported on 10/27/2023)   diclofenac (VOLTAREN) 75 MG EC tablet Take 75 mg by mouth 2 (two) times daily. (Patient not taking: Reported on 10/27/2023)   fluconazole (DIFLUCAN) 150 MG tablet Take one tablet by mouth today, and then may repeat in 5 days if symptoms persist. (Patient not taking: Reported on 10/27/2023)   No facility-administered encounter medications on file as of 10/27/2023.      Medical History: Past Medical History:  Diagnosis Date   Asthma    H/O infectious mononucleosis 2021     Vital Signs: Pulse (!) 101   Temp 97.6 F (36.4 C) (Tympanic)   SpO2 99%    Review of Systems  Constitutional:  Negative for chills, fatigue and fever.  Eyes:  Negative for pain and itching.  Musculoskeletal:        Discomfort/pain in perineum.    Physical  Exam Vitals reviewed.  Constitutional:      Appearance: Normal appearance.  Genitourinary:    Prostate: Tender. Not enlarged and no nodules present.     Rectum: Normal. No mass, tenderness or external hemorrhoid.       Comments: Perineum tenderness as marked in diagram above. Neurological:     Mental Status: He is alert.    Assessment/Plan: 1. Other prostatic inflammatory diseases Take Advil as discussed.  Sit in warm bath at least twice daily.  Take Doxycycline as prescribed. Follow up via MyChart messenger if symptoms fail to improve or may return to clinic as needed for worsening symptoms.   - doxycycline (VIBRA-TABS) 100 MG tablet; Take 1 tablet (100 mg total) by mouth 2 (two) times daily.  Dispense: 20 tablet; Refill: 0     General Counseling: [name] verbalizes understanding of the findings of todays visit and agrees with plan of treatment. I have discussed any further diagnostic evaluation that may be needed or ordered today. We also reviewed his medications today. he has been encouraged to call the office with any questions or concerns that should arise related to todays visit.   No orders of the defined types were placed in this encounter.   Meds ordered this encounter  Medications   doxycycline (VIBRA-TABS) 100 MG tablet    Sig: Take 1 tablet (100 mg total) by mouth 2 (two) times daily.    Dispense:  20 tablet    Refill:  0    Time spent:15  Minutes Time spent includes review of chart, medications, test results, and follow up plan with the patient.    Johnna Acosta AGNP-C Nurse Practitioner

## 2023-10-29 ENCOUNTER — Encounter: Payer: Self-pay | Admitting: Adult Health

## 2023-11-11 ENCOUNTER — Encounter: Payer: Self-pay | Admitting: Adult Health

## 2023-11-13 ENCOUNTER — Ambulatory Visit (INDEPENDENT_AMBULATORY_CARE_PROVIDER_SITE_OTHER): Payer: 59 | Admitting: Adult Health

## 2023-11-13 ENCOUNTER — Encounter: Payer: Self-pay | Admitting: Adult Health

## 2023-11-13 VITALS — HR 87 | Temp 98.0°F

## 2023-11-13 DIAGNOSIS — Z23 Encounter for immunization: Secondary | ICD-10-CM | POA: Diagnosis not present

## 2023-11-13 DIAGNOSIS — Z113 Encounter for screening for infections with a predominantly sexual mode of transmission: Secondary | ICD-10-CM

## 2023-11-13 DIAGNOSIS — J4520 Mild intermittent asthma, uncomplicated: Secondary | ICD-10-CM | POA: Diagnosis not present

## 2023-11-13 MED ORDER — ALBUTEROL SULFATE HFA 108 (90 BASE) MCG/ACT IN AERS: 2.0000 | INHALATION_SPRAY | Freq: Four times a day (QID) | RESPIRATORY_TRACT | 2 refills | Status: AC | PRN

## 2023-11-13 NOTE — Progress Notes (Signed)
[facility] [address] [place], [place] [address] Phone: [phone] Fax: [phone]   Office Visit Note  Patient Name: Steve Myers [facility]  Date of Birth:19-Jun-2001  Med Rec number [id number]  Date of Service: 11/13/2023  Bevelyn Buckles (malabar nut tree) [justicia adhatoda] and Peanut-containing drug products  Chief Complaint  Patient presents with   STD     HPI  Patient is here for STI testing.  He was recently treated for chlamydia.  His partner was also treated.  Denies any symptoms.  He is going on a trip to [place]. He wanted to make sure he doesn't need any vaccines.  He also is requesting a refill on his inhaler.    Current Medication:  Outpatient Encounter Medications as of 11/13/2023  Medication Sig   albuterol (VENTOLIN HFA) 108 (90 Base) MCG/ACT inhaler Inhale 2 puffs into the lungs every 6 (six) hours as needed for wheezing or shortness of breath. (Patient not taking: Reported on 10/27/2023)   cetirizine (ZYRTEC) 10 MG tablet Take 10 mg by mouth daily.   diclofenac (VOLTAREN) 75 MG EC tablet Take 75 mg by mouth 2 (two) times daily. (Patient not taking: Reported on 10/27/2023)   doxycycline (VIBRA-TABS) 100 MG tablet Take 1 tablet (100 mg total) by mouth 2 (two) times daily.   fluconazole (DIFLUCAN) 150 MG tablet Take one tablet by mouth today, and then may repeat in 5 days if symptoms persist. (Patient not taking: Reported on 10/27/2023)   No facility-administered encounter medications on file as of 11/13/2023.      Medical History: Past Medical History:  Diagnosis Date   Asthma    H/O infectious mononucleosis 2021     Vital Signs: Pulse 87   Temp 98 F (36.7 C) (Tympanic)   SpO2 99%    Review of Systems  Constitutional:  Negative for chills, fatigue and fever.  Eyes:  Negative for pain, redness and itching.    Physical Exam Vitals reviewed.  Constitutional:      Appearance: Normal appearance.  HENT:     Head:  Normocephalic.     Right Ear: Tympanic membrane and ear canal normal.     Left Ear: Ear canal normal.     Nose: Nose normal.  Eyes:     Pupils: Pupils are equal, round, and reactive to light.  Pulmonary:     Effort: Pulmonary effort is normal.     Breath sounds: Normal breath sounds.  Lymphadenopathy:     Cervical: No cervical adenopathy.  Neurological:     Mental Status: He is alert.    Assessment/Plan: 1. Screening examination for venereal disease Discussed available testing options.  Patient elected for Gonorrhea, Chlamydia, Trichomonas testing today.  Patient will be contacted via MyChart when labs results are available.  Encouraged consistent condom use, as well as regular testing (every 3-6 months) when changing sex partners, or when having multiple partners.    - Chlamydia/Gonococcus/Trichomonas, NAA  2. Mild intermittent asthma, unspecified whether complicated - albuterol (VENTOLIN HFA) 108 (90 Base) MCG/ACT inhaler; Inhale 2 puffs into the lungs every 6 (six) hours as needed for wheezing or shortness of breath.  Dispense: 8 g; Refill: 2  3. Needs flu shot - Flu vaccine trivalent PF, 6mos and older(Flulaval,Afluria,Fluarix,Fluzone)     General Counseling: Argie verbalizes understanding of the findings of todays visit and agrees with plan of treatment. I have discussed any further diagnostic evaluation that may be needed or ordered today. We also reviewed his medications today.  he has been encouraged to call the office with any questions or concerns that should arise related to todays visit.   No orders of the defined types were placed in this encounter.   No orders of the defined types were placed in this encounter.   Time spent:20 Minutes Time spent includes review of chart, medications, test results, and follow up plan with the patient.    Johnna Acosta AGNP-C Nurse Practitioner

## 2023-11-15 ENCOUNTER — Encounter: Payer: Self-pay | Admitting: Adult Health

## 2023-11-16 ENCOUNTER — Encounter: Payer: Self-pay | Admitting: Adult Health

## 2023-11-16 LAB — CHLAMYDIA/GONOCOCCUS/TRICHOMONAS, NAA
Chlamydia by NAA: NEGATIVE
Gonococcus by NAA: NEGATIVE
Trich vag by NAA: NEGATIVE

## 2023-12-28 ENCOUNTER — Encounter: Payer: Self-pay | Admitting: Adult Health

## 2024-01-18 ENCOUNTER — Encounter: Payer: Self-pay | Admitting: Adult Health

## 2024-01-18 ENCOUNTER — Ambulatory Visit (INDEPENDENT_AMBULATORY_CARE_PROVIDER_SITE_OTHER): Payer: 59 | Admitting: Adult Health

## 2024-01-18 VITALS — HR 97 | Temp 98.6°F

## 2024-01-18 DIAGNOSIS — L290 Pruritus ani: Secondary | ICD-10-CM | POA: Diagnosis not present

## 2024-01-18 MED ORDER — MEBENDAZOLE 100 MG PO CHEW: 100.0000 mg | CHEWABLE_TABLET | Freq: Two times a day (BID) | ORAL | 0 refills | Status: DC

## 2024-01-18 NOTE — Progress Notes (Signed)
 [facility] [address] [place], [place] [address] Phone: [phone] Fax: [phone]   Office Visit Note  Patient Name: Steve Myers [facility]  Date of Birth:2001-05-21  Med Rec number [id number]  Date of Service: 01/18/2024  Steve Myers (malabar nut tree) [justicia adhatoda] and Peanut-containing drug products  Chief Complaint  Patient presents with   Acute Visit     HPI Patient is here reporting rectal itching for one month.  He spent about 10 days in [place] in December over winter break.  He started having rectal itching mostly at night, about one month later.  He has tried multiple creams, including Preparation H.  Denies rectal bleeding, pain or other symptoms.  No constipation, or diarrhea.    Current Medication:  Outpatient Encounter Medications as of 01/18/2024  Medication Sig   albuterol  (VENTOLIN  HFA) 108 (90 Base) MCG/ACT inhaler Inhale 2 puffs into the lungs every 6 (six) hours as needed for wheezing or shortness of breath.   cetirizine (ZYRTEC) 10 MG tablet Take 10 mg by mouth daily.   mebendazole  (VERMOX ) 100 MG chewable tablet Chew 1 tablet (100 mg total) by mouth 2 (two) times daily.   diclofenac  (VOLTAREN ) 75 MG EC tablet Take 75 mg by mouth 2 (two) times daily. (Patient not taking: Reported on 01/18/2024)   doxycycline  (VIBRA -TABS) 100 MG tablet Take 1 tablet (100 mg total) by mouth 2 (two) times daily. (Patient not taking: Reported on 01/18/2024)   fluconazole  (DIFLUCAN ) 150 MG tablet Take one tablet by mouth today, and then may repeat in 5 days if symptoms persist. (Patient not taking: Reported on 01/18/2024)   No facility-administered encounter medications on file as of 01/18/2024.      Medical History: Past Medical History:  Diagnosis Date   Asthma    H/O infectious mononucleosis 2021     Vital Signs: Pulse 97   Temp 98.6 F (37 C) (Tympanic)   SpO2 100%    Review of Systems  Constitutional:  Negative for chills, fatigue  and fever.  Gastrointestinal:  Negative for abdominal pain, anal bleeding, blood in stool, constipation, diarrhea, nausea and vomiting.       Rectal itching    Physical Exam Vitals and nursing note reviewed.  Constitutional:      Appearance: Normal appearance.  HENT:     Right Ear: Ear canal normal.  Genitourinary:      Comments: Mild redness around rectal opening.  Some small string like substance at rectal opening, and just inside of anus upon inspection. Suspicious for parasitic worms.   Neurological:     Mental Status: He is alert.    Assessment/Plan: 1. Rectal itching (Primary) Suspicious for pinworms given symptoms and exam. Take Vermox , and follow up if symptom fail to improve.  - mebendazole  (VERMOX ) 100 MG chewable tablet; Chew 1 tablet (100 mg total) by mouth 2 (two) times daily.  Dispense: 12 tablet; Refill: 0     General Counseling: Steve Myers verbalizes understanding of the findings of todays visit and agrees with plan of treatment. I have discussed any further diagnostic evaluation that may be needed or ordered today. We also reviewed his medications today. he has been encouraged to call the office with any questions or concerns that should arise related to todays visit.   No orders of the defined types were placed in this encounter.   Meds ordered this encounter  Medications   mebendazole  (VERMOX ) 100 MG chewable tablet    Sig: Chew 1 tablet (100 mg  total) by mouth 2 (two) times daily.    Dispense:  12 tablet    Refill:  0    Time spent:15 Minutes Time spent includes review of chart, medications, test results, and follow up plan with the patient.    Sheria Dills AGNP-C Nurse Practitioner

## 2024-01-19 ENCOUNTER — Encounter: Payer: Self-pay | Admitting: Adult Health

## 2024-02-09 ENCOUNTER — Encounter: Payer: Self-pay | Admitting: Adult Health

## 2024-02-09 ENCOUNTER — Other Ambulatory Visit: Payer: Self-pay | Admitting: Adult Health

## 2024-02-09 MED ORDER — AMOXICILLIN-POT CLAVULANATE 875-125 MG PO TABS: 1.0000 | ORAL_TABLET | Freq: Two times a day (BID) | ORAL | 0 refills | Status: DC

## 2024-03-16 ENCOUNTER — Encounter: Payer: Self-pay | Admitting: Adult Health

## 2024-03-16 ENCOUNTER — Ambulatory Visit (INDEPENDENT_AMBULATORY_CARE_PROVIDER_SITE_OTHER): Admitting: Adult Health

## 2024-03-16 VITALS — HR 81 | Temp 98.7°F

## 2024-03-16 DIAGNOSIS — Z113 Encounter for screening for infections with a predominantly sexual mode of transmission: Secondary | ICD-10-CM | POA: Diagnosis not present

## 2024-03-16 DIAGNOSIS — R369 Urethral discharge, unspecified: Secondary | ICD-10-CM | POA: Diagnosis not present

## 2024-03-16 DIAGNOSIS — M545 Low back pain, unspecified: Secondary | ICD-10-CM

## 2024-03-16 MED ORDER — DICLOFENAC SODIUM 75 MG PO TBEC: 75.0000 mg | DELAYED_RELEASE_TABLET | Freq: Two times a day (BID) | ORAL | 0 refills | Status: AC

## 2024-03-16 NOTE — Progress Notes (Signed)
 [facility] [address] [place], [place] [address] Phone: [phone] Fax: [phone]   Office Visit Note  Patient Name: Steve Myers [facility]  Date of Birth:Sep 08, 2001  Med Rec number [id number]  Date of Service: 03/16/2024  Bevelyn Buckles (malabar nut tree) [justicia adhatoda] and Peanut-containing drug products  Chief Complaint  Patient presents with   std     HPI Patient is here requesting sti testing. He has had one brief sexual encounter unprotected a few weeks ago.    Current Medication:  Outpatient Encounter Medications as of 03/16/2024  Medication Sig   albuterol (VENTOLIN HFA) 108 (90 Base) MCG/ACT inhaler Inhale 2 puffs into the lungs every 6 (six) hours as needed for wheezing or shortness of breath.   cetirizine (ZYRTEC) 10 MG tablet Take 10 mg by mouth daily.   amoxicillin-clavulanate (AUGMENTIN) 875-125 MG tablet Take 1 tablet by mouth 2 (two) times daily. (Patient not taking: Reported on 03/16/2024)   diclofenac (VOLTAREN) 75 MG EC tablet Take 1 tablet (75 mg total) by mouth 2 (two) times daily.   fluconazole (DIFLUCAN) 150 MG tablet Take one tablet by mouth today, and then may repeat in 5 days if symptoms persist. (Patient not taking: Reported on 10/27/2023)   mebendazole (VERMOX) 100 MG chewable tablet Chew 1 tablet (100 mg total) by mouth 2 (two) times daily.   [DISCONTINUED] diclofenac (VOLTAREN) 75 MG EC tablet Take 75 mg by mouth 2 (two) times daily. (Patient not taking: Reported on 10/27/2023)   No facility-administered encounter medications on file as of 03/16/2024.      Medical History: Past Medical History:  Diagnosis Date   Asthma    H/O infectious mononucleosis 2021     Vital Signs: Pulse 81   Temp 98.7 F (37.1 C) (Tympanic)   SpO2 98%    Review of Systems  Constitutional:  Negative for fatigue and fever.  Genitourinary:  Positive for penile discharge. Negative for dysuria, genital sores, penile pain, penile swelling and  testicular pain.    Physical Exam Vitals reviewed.  Constitutional:      Appearance: Normal appearance.  Genitourinary:    Penis: Circumcised. Discharge present.   Neurological:     Mental Status: He is alert.    Assessment/Plan: 1. Screening examination for venereal disease (Primary) Discussed available testing options.  Patient elected for Gonorrhea, Chlamydia testing today.  Patient will be contacted via MyChart when labs results are available.  Encouraged consistent condom use, as well as regular testing (every 3-6 months) when changing sex partners, or when having multiple partners.    - GC/Chlamydia Probe Amp  2. Penile discharge - GC/Chlamydia Probe Amp  3. Acute left-sided low back pain without sciatica - diclofenac (VOLTAREN) 75 MG EC tablet; Take 1 tablet (75 mg total) by mouth 2 (two) times daily.  Dispense: 60 tablet; Refill: 0     General Counseling: [name] verbalizes understanding of the findings of todays visit and agrees with plan of treatment. I have discussed any further diagnostic evaluation that may be needed or ordered today. We also reviewed his medications today. he has been encouraged to call the office with any questions or concerns that should arise related to todays visit.   Orders Placed This Encounter  Procedures   GC/Chlamydia Probe Amp    Meds ordered this encounter  Medications   diclofenac (VOLTAREN) 75 MG EC tablet    Sig: Take 1 tablet (75 mg total) by mouth 2 (two) times daily.    Dispense:  60 tablet    Refill:  0    Time spent:15 Minutes Time spent includes review of chart, medications, test results, and follow up plan with the patient.    Johnna Acosta AGNP-C Nurse Practitioner

## 2024-03-19 LAB — GC/CHLAMYDIA PROBE AMP
Chlamydia trachomatis, NAA: NEGATIVE
Neisseria Gonorrhoeae by PCR: NEGATIVE

## 2024-04-07 ENCOUNTER — Encounter: Payer: Self-pay | Admitting: Adult Health

## 2024-04-07 ENCOUNTER — Other Ambulatory Visit: Payer: Self-pay | Admitting: Adult Health

## 2024-04-07 DIAGNOSIS — N410 Acute prostatitis: Secondary | ICD-10-CM

## 2024-04-07 MED ORDER — TAMSULOSIN HCL 0.4 MG PO CAPS: 0.4000 mg | ORAL_CAPSULE | Freq: Every day | ORAL | 0 refills | Status: DC

## 2024-04-07 MED ORDER — DOXYCYCLINE HYCLATE 100 MG PO TABS: 100.0000 mg | ORAL_TABLET | Freq: Every day | ORAL | 0 refills | Status: DC

## 2024-04-07 NOTE — Progress Notes (Signed)
 Sent flomax  for trial, and Month of doxy for suspected prostatitis.  See message chain.

## 2024-08-22 NOTE — Therapy (Unsigned)
 OUTPATIENT PHYSICAL THERAPY EVALUATION   Patient Name: Steve Myers MRN: [id number] DOB:[birth year], 23 y.o., male Today's Date: 08/23/2024  END OF SESSION:  PT End of Session - 08/23/24 1926     Visit Number 1    Number of Visits 17    Date for PT Re-Evaluation 11/15/24    Authorization Type [employer] reporting period from 08/23/2024    Authorization Time Period VL: 60 PT/OT/ST - 58 remain    Authorization - Visit Number 1    Authorization - Number of Visits 58    Progress Note Due on Visit 10    PT Start Time 1520    PT Stop Time 1615    PT Time Calculation (min) 55 min    Activity Tolerance Patient tolerated treatment well    Behavior During Therapy WFL for tasks assessed/performed          Past Medical History:  Diagnosis Date   Asthma    H/O infectious mononucleosis 2021   History reviewed. No pertinent surgical history. Patient Active Problem List   Diagnosis Date Noted   Nasal injury, initial encounter 08/06/2022   Mild intermittent asthma 08/06/2022   Acute URI 08/06/2022    PCP: Lauraine PHEBE Fairy, MD  REFERRING PROVIDER: Robby Epley, DO [facility])  REFERRING DIAG: lumbar radiculitis  Rationale for Evaluation and Treatment: Rehabilitation  THERAPY DIAG:  Other low back pain  Neuralgia and neuritis  ONSET DATE: since pt was 23 years old.   SUBJECTIVE:                                                                                                                                                                                           SUBJECTIVE STATEMENT: Patient states he had back pain since he was 10. He states it started more sporadic, like he slept wrong, take advil , move on with his life. As he got older, that pain became more frequent and more painful. This was gradual from ages [age]. This seemed random and unable to pinpoint what the problem was. Around [age] he got his first MRI which  showed a bulging disc. The person he worked with was not super helpful, so she said to stretch and do yoga, swim. When he got to college it became a lot worse and the frequency of the episodes went from 5-10 min of lower back pain and discomfort to pain that would last hours and would leave him unable to move. There have been episodes recently in terms of the last 2-3 years where he would be fine until he would step out of his  car wrong that would leave him laying on his dorm floor not able to breathe fully because it was putting pressure on his lower back. That level of pain kind of rapidly moved up in how frequent it would be. Instead of once a month it became once every day, twice every day. Then over the last year it has really impacted his life. He simply could not operate every day because he was in so much pain. He went on a trip last November (to [place] ). For the whole trip when his friends went on a hike or socializing he was laying on his back in the house on the floor for 6 hours. This summer he got another MRI which showed a further degeneration of the area around the disc and bulging got more prominent, so with that he tried physical therapy for 3 weeks. It did not do anything, and he got cortisone shot in L4-L5 lumbar area, and that is the first medical thing he has done to his back becauase surgery is not really something he wants to do because of his age. He was hoping to get a lot of relief and he actually got none, which is disappointing. He notes an injection is supposed to be paired with PT to get maximum benefit. He got that injection on August 10th or 15th. 4-5 weeks to this point. He expects a 2-3 week timeline for the injection to lead to improvement. He brought his MRI discs from first and 2nd MRI to today's appointments as well as other medical history.  And he has the referral and an after care summary of the shot and everhything he did. And an extensive medical history. He states he is  in a decade into this problem.   He states his doctors said he has something called degenerative disc disease, which he understands as a general term.   He states one day he will wake up and feel relatively fine, able to move, functoinal and be able to play basketball. But then the next day or later in the day he will do one little thing and it will put him on the floor for hours. His limitations change wildly a lot.   Location? Central low back and moves from left to right. Will have pressure/pain on his hips and down to his hamstrings and has felt it in his quads before, does not radiate below knees. Sometimes he feels tingling in these areas.     Where do you feel most symptoms? Middle of low back Anything other than pain(weakness or paresthesias)? He feels weakness sometimes (he drove from [place] to [place] - 7[place]8 hours - got on a ferry for an hour the second after stepping off the ferry it felt like his spine collapsed. He could not stand up so he sat on a bench for 20 minutes). Weakness was in his low back. This was a month ago. How would you describe the pain? Excruciating, back will randomly feel like he is in a vice with knives at every corner, pressure, tightness and any wrong move is stabbing.  Describe your symptoms on a 24 hours clock:  varies too much to describe Aggravating Factors: What Movements, Activities, Positions, Situations (MAPS) make you feel worse or bring on your symptoms? None, maybe sitting for an extended period (like driving from [place] to [place], 89-87 hours) because after that he will definitely notice it.  Easing: pressing himself up from the chair feels better, unsure: has described laying flat  on back with a heating pad and taking pain killer type things or advil  or tylenol , sitting when he felt weak, not moving.  Tolerance: incrased pain hits suddenly Reactivity/Irritability- How long does a flare up last? Intolerable period ranges from 25 min and 8 hours, then add 10 hours  (the other half of the day) for the pain to return to baseline. Often the next day.   Special Questions:  . episodes of extreme dizziness, fainting or blackouts? No related to this, but he vasovagal response where he gets dizzy or passes out when he gets invasive procedures (tube up nose and down through).   SABRA extreme apprehension of flexing the head forward or unable to lie down flat? no  . bilateral numbness in the hands and/or feet with movement of the head? no  PERTINENT HISTORY:  Patient is a 23 y.o. male who presents to outpatient physical therapy with a referral for medical diagnosis lumbar radiculitis with instructions to see this particular PT for assessment of MDT evaluation and treatment. This patient's chief complaints consist of low back pain that radiates around his hips and glutes and down the back of his thighs, leading to the following functional deficits: difficulty with all aspects of mobility, ADLs, IADLs, work, school, sports, and leisure. Relevant past medical history and comorbidities include the following: he has Nasal injury, initial encounter; Mild intermittent asthma; and Acute URI on their problem list. he  has a past medical history of Asthma and H/O infectious mononucleosis (2021), learning disability, irritable bowel syndrome, bicipital tenosynovitis, peanut allergy, tree nut allergy. Patient denies hx of cancer, stroke, seizures, lung problems, heart problems, diabetes, unexplained weight loss, unexplained changes in bowel or bladder problems, unexplained stumbling or dropping things, osteoporosis, and spinal surgery  Exercise history: In terms of his physical activity, for the last 3 years he has been weight lifting 5-6x/week. Since he has back problems, he has been very mindful to try not to utilize his back, so he avoids deadlifst and squats. He avoids putting pressure on his back. He thinks that has backfired on him so he has decreased strength in the area so tolerance  to twisting his back os now very low. In highschool and earlier years he played [employer] and things like that, but there was no specific event that messed up his back. He would love to figure out a game plan because he is young.    PAIN: Are you having pain? Yes NPRS: Current: 6/10,  Best: 4/10, Worst: 10/10. Pain location: Central low back (worst) and moves from left to right. Will have pressure/pain on his hips and down to his hamstrings and has felt it in his quads before, does not radiate below knees. Sometimes he feels tingling in these areas.   Pain description: Excruciating, back will randomly feel like he is in a vice with knives at every corner, pressure, tightness and any wrong move is stabbing.  Aggravating factors: None, maybe sitting for an extended period (like driving from [place] to [place], 89-87 hours) because after that he will definitely notice it.  Relieving factors: pressing himself up from the chair feels better, unsure, has described laying flat on back with a heating pad and taking pain killer type things or advil  or tylenol , sitting when he felt weak, not moving.    PRECAUTIONS: None  FALLS:  Has patient fallen in last 6 months? Did not ask  OCCUPATION: full time college student (finance)  PLOF: Independent, no limitations due to his back  PATIENT GOALS: He would love to not be in pain anymore, ever. He thinks that might not be possible, and if it is possible he expects it to take an extensive amount of time. He would like to be able to get to a place where if he is in in pain, he can manage it and get out of it and move on and being optimistic he would love to never have pain again.   OBJECTIVE  DIAGNOSTIC FINDINGS:  Lumbar MRI report from 05/25/2024:  CLINICAL HISTORY: Radiculopathy, lumbar region  COMPARISON: MRI dated 11/30/2021  TECHNIQUE: Sagittal T1, sagittal T2 FSE, sagittal T2 FSE fat-sat, axial T1 and axial T2 FSE sequences.   FINDINGS:  The lumbar vertebral  bodies are of normal height and marrow signal. There is no evidence of spondylolisthesis. Intervertebral disc spaces are of normal height and signal. The conus medullaris is of normal caliber, position and signal.   Alignment: There is normal lumbar lordosis. There is no spondylolisthesis.   Vertebrae: There is no compression fracture. There is no appreciable spondylolysis.   Bone signal: Bone signal is within normal limits.   Spinal cord: Inferior tip of conus is at the T12-L1 level. Imaged spinal cord has normal morphology and signal intensity. There is no evidence of spinal cord impingement or other cord pathology. Cauda equina  and filum terminale are normal.   General degenerative changes: There is multilevel disc desiccation.   T12-L1: Normal disc hydration. There is no significant disc displacement, disc-osteophyte, spinal canal stenosis or neural foraminal narrowing.   L1-L2: There is no significant disc displacement, disc-osteophyt, spinal canal stenosis or neural foraminal narrowing.  L2-L3: An asymmetric disc bulge, larger along the right lateral annulus is seen. This abuts the existing nerve root which appears enlarged. The left foramen is patent. The ventral nerve roots are well preserved. No substantial change.   L3-L4: There is no significant disc displacement, disc-osteophyte, spinal canal stenosis or neural foraminal narrowing.   L4-L5: A circumferential disc bulge, slightly larger along the left posterior lateral annulus with displacement of the left ventral nerve root, posteriorly.  Mild central spinal conal stenosis. Mild narrowing at the origin of the right and left foramen. No significant interval change. The disc bulge appears larger when compared to the previous examination.   L5-S1: A circumferential disc bulge is present at this level causing mild to moderate narrowing at the origin of the right foramen. There is mild narrowing at the origin of the left foramen.    Additional observations: The imaged extraspinal structures are normal.   IMPRESSION:  Multilevel degenerative disc disease of the lumbar spin as described above.   2. Enlargement of the exiting right L2 nerve root secondary to mass effect from disc bulge. No edema in the exiting nerve root.   3. Annular bulge at the L4-L5 level displaces the left ventral nerve root posterior. Mild central spinal canal stenosis.   4. Annular bulge at the L5-S1 level causing mild to moderate narrowing at the origin of the right foramen.        SELF-REPORTED FUNCTION Modified Oswestry Disability Index (mODI): 20% (range 0-100%) He states the answers to the questionnaire he filled out (Modified Oswestry) depends on the day, so it was hard for him to answer accurately.   EXAMINATION  Sitting Posture: neutral Change of Posture Effect: not tested Standing Posture: neutral Lateral Shift: nil Shift Relevance: No   Neurological  Motor Deficit: able to heel and toe walk bilaterally, more detailed exam  deferred as needed.  Reflexes:  Patellar Reflex (L3-L4):  R: absent L: 1+  Achilles Reflex (S1) R: 2+ L: 3+  Sensory Deficit: deferred Neurodynamic Tests:  Slump Flexion based  R  = end range tension/back pain relieved at neck and foot  L  = end range tension/back pain relieved at neck and foot   No adhesion bilaterally Extension based  R  = earlier back and leg pulling, relieves separately at neck ( back) and foot (leg) L  = earlier back and leg pulling, relieves separately at neck ( back) and foot (leg)   Movement Loss Movement Loss Symptoms  Flexion min Fingers to mid shins, (normal is a little above ankles)Tightness in hamstrings  Extension mod Slight increase pain lower left side  Side Gliding R nil Pain on right  Side Gliding L nil Pain on left    Repeated Motions Testing Test Movement Symptom During Symptom After Mechanical Response Key Functional Test  FIS produces no worse     EIS produces no worse    EIL no effect no effect    REIL 1x10 increases worse, increased central low back pain                       STATIC TESTS deferred   TREATMENT                                                                                                                           Therapeutic exercise: therapeutic exercises that incorporate ONE parameter at one or more areas of the body to centralize symptoms, develop strength and endurance, range of motion, and flexibility required for successful completion of functional activities. REIL (repeated lumbar extension in lying) 1x10 Educated to perform throughout the day  Self-Care/Home Management Training: to educate patient in self care including ADL training, meal preparation, compensatory training, safety procedures/instructions, use of assistive technology devices or adaptive equipment.  Educated on how to perform prone lying in extension (prone on elbows) should he experience a flair in symptoms before next visit.  Educated on MDT/McKenzie, POC, other approaches, discogenic pain vs nerve root impingement.   Pt required multimodal cuing for proper technique and to facilitate improved neuromuscular control, strength, range of motion, and functional ability resulting in improved performance and form.  PATIENT EDUCATION:  Education details: Exercise purpose/form. Self management techniques. Education on diagnosis, prognosis, POC, anatomy and physiology of current condition. Education on HEP (forgot to provide handout) Person educated: Patient Education method: Explanation, Actor cues, and Verbal cues Education comprehension: verbalized understanding, returned demonstration, and needs further education  HOME EXERCISE PROGRAM: Access Code: [email or web address] URL: [email or web address] Date: 08/23/2024 Prepared by: Camie Cleverly  Exercises - Static Prone on Elbows  - 2 x daily - 5 minutes hold - Prone Press Up  - 4 x  daily - 10-15 reps - 1 second hold  ASSESSMENT:  CLINICAL IMPRESSION: Patient is a 23 y.o. male referred to outpatient physical therapy  with a medical diagnosis of lumbar radiculitis who presents with signs and symptoms consistent with chronic low back pain with radiation to B LE proximal to the knees. Learned partway through subjective that pt had been referred specifically to this PT for MDT/McKenzie assessment, so exam changed directions to specifically follow that system of evaluation and treatment. Provisional MDT classification of lumbar derangement syndrome with extension preference, but will need more testing to confirm, especially since he had minimal information to provide on agg/ease factors. However, the variability in symptoms strongly points to derangement syndrome classification or classification of other. Descriptions of when his pain is worse including after long rides of many hours suggests sensitivity to load/flexion and possible extension preference, although he had increased centralized pain at the base of his spine after REIL. Patient to trial some REIL until next, but needs further education on directional preference and how to modify activities to help confirm/deny directional preference and improve his pain. He did have decreased DTR on the right and bilateral nerve tension which suggests nerve tension sensitivity with current or history of nerve root irritation, R > L. Patient did not have an obvious strength deficits, but formal myotome testing deferred and pt describes weakness in his back and avoidance of strength training in his back. Patient educated to avoid positions of nerve tension at this point, but needs further education on this. Patient presents with significant pain, ROM, joint stiffness, motor control, muscle tension, muscle performance (strength/power/endurance), and activity tolerance impairments that are limiting ability to complete all aspects of mobility, ADLs,  IADLs, work, school, sports, and leisure without difficulty. Patient will benefit from skilled physical therapy intervention to address current body structure impairments and activity limitations to improve function and work towards goals set in current POC in order to return to prior level of function or maximal functional improvement.   MDT provisional classification: lumbar derangement syndrome with extension directional preference (vs classification of other) Mechanical sensitivities: neural tension, likely load (needs further testing), and directional.   OBJECTIVE IMPAIRMENTS: decreased activity tolerance, decreased coordination, decreased endurance, decreased knowledge of condition, decreased mobility, difficulty walking, decreased ROM, decreased strength, increased muscle spasms, impaired flexibility, and pain.   ACTIVITY LIMITATIONS: carrying, lifting, bending, sitting, standing, squatting, sleeping, stairs, transfers, bed mobility, dressing, and locomotion level  PARTICIPATION LIMITATIONS: meal prep, cleaning, laundry, interpersonal relationship, driving, community activity, occupation, school, and athletics and leisure  PERSONAL FACTORS: Age, Past/current experiences, and Time since onset of injury/illness/exacerbation are also affecting patient's functional outcome.   REHAB POTENTIAL: Good  CLINICAL DECISION MAKING: Evolving/moderate complexity  EVALUATION COMPLEXITY: Moderate   GOALS: Goals reviewed with patient? No  SHORT TERM GOALS: Target date: 09/06/2024  Patient will be independent with initial home exercise program for self-management of symptoms. Baseline: Initial HEP to be provided at visit 2 as appropriate (08/23/24); Goal status: INITIAL   LONG TERM GOALS: Target date: 11/15/2024  Patient will be independent with a long-term home exercise program for self-management of symptoms.  Baseline: Initial HEP to be provided at visit 2 as appropriate (08/23/24); Goal  status: INITIAL  2.  Patient will demonstrate improved Modified Oswestry Disability Index (mODI) to equal or less than 10% to demonstrate improvement in overall condition and self-reported functional ability.  Baseline: 20% (08/23/24); Goal status: INITIAL  3.  Patient will demonstrate to complete barbell back squat and dead lift 1x10 reps at a moderately challenging (defined by patient) load with no increase in low back pain or symptoms for 24 hours afterwards  to improve his ability to work out and perform athletic activities without disruption.  Baseline: avoids squats and dead lifts (08-31-2024); Goal status: INITIAL  4.  Patient will demonstrate the ability to abolish his pain following a flair to improve his ability to confidently participate in ADLs, IADLs, work/school responsibilities, and leisure activities.   Baseline: unable to find a way to decrease pain once it is triggered besides waiting (2024/08/31); Goal status: INITIAL  5.  Patient will demonstrate improvement in Patient Specific Functional Scale (PSFS) of equal or greater than 8/10 points to reflect clinically significant improvement in patient's most valued functional activities. Baseline: to be measured at visit 2 as appropriate (08/31/2024); Goal status: INITIAL  6.  Patient will report NPRS equal or less than 3/10 during functional activities during the last 2 weeks to improve their abilitly to complete community, work and/or recreational activities with less limitation. Baseline: up to 10/10 (August 31, 2024); Goal status: INITIAL   PLAN:  PT FREQUENCY: 1-2x/week  PT DURATION: 12 weeks  PLANNED INTERVENTIONS: 97164- PT Re-evaluation, 97750- Physical Performance Testing, 97110-Therapeutic exercises, 97530- Therapeutic activity, [id number]- Neuromuscular re-education, 97535- Self Care, 02859- Manual therapy, [id number]- Electrical stimulation (unattended), 20560 (1-2 muscles), 20561 (3+ muscles)- Dry Needling, Patient/Family education,  Cryotherapy, and Moist heat.  PLAN FOR NEXT SESSION: update HEP as appropriate, confirm MDT classification, reduce derangement, maintain reduction, recover function, and educate on prophylaxis (over the course of the POC). establish directional preference if present, matched intervention/repeated motions for directional preference, education on directional preference/mechanical stressors and modifications of activities to avoid them, recover motor control and awareness of modifiers to mechanical sensitivities as appropriate, retrain motor patterns, regain ROM, improve strength and resilience needed for performing desired functional performance with appropriate ROM, strength, power, and endurance. Manual therapy/Dry Needling as needed.    Camie SAUNDERS. Juli, PT, DPT, Cert. MDT 2024/08/31, 8:39 PM  [facility] [address] [place], [place] [address] P: [phone] I F: [phone]

## 2024-08-23 ENCOUNTER — Ambulatory Visit: Admitting: Physical Therapy

## 2024-08-23 ENCOUNTER — Encounter: Payer: Self-pay | Admitting: Physical Therapy

## 2024-08-23 DIAGNOSIS — M5459 Other low back pain: Secondary | ICD-10-CM | POA: Insufficient documentation

## 2024-08-23 DIAGNOSIS — M792 Neuralgia and neuritis, unspecified: Secondary | ICD-10-CM | POA: Diagnosis present

## 2024-08-24 NOTE — Therapy (Unsigned)
 OUTPATIENT PHYSICAL THERAPY TREATMENT   Patient Name: Steve Myers MRN: [id number] DOB:[birth year], 23 y.o., male Today's Date: 08/25/2024  END OF SESSION:  PT End of Session - 08/25/24 1957     Visit Number 2    Number of Visits 17    Date for Recertification  11/15/24    Authorization Type [employer] reporting period from 08/23/2024    Authorization Time Period VL: 60 PT/OT/ST - 58 remain    Authorization - Visit Number 2    Authorization - Number of Visits 58    Progress Note Due on Visit 10    PT Start Time 1525    PT Stop Time 1625    PT Time Calculation (min) 60 min    Activity Tolerance Patient tolerated treatment well;Patient limited by pain    Behavior During Therapy [facility] for tasks assessed/performed           Past Medical History:  Diagnosis Date   Asthma    H/O infectious mononucleosis 2021   History reviewed. No pertinent surgical history. Patient Active Problem List   Diagnosis Date Noted   Nasal injury, initial encounter 08/06/2022   Mild intermittent asthma 08/06/2022   Acute URI 08/06/2022    PCP: Lauraine PHEBE Fairy, MD  REFERRING PROVIDER: Robby Epley, DO [facility])  REFERRING DIAG: lumbar radiculitis  Rationale for Evaluation and Treatment: Rehabilitation  THERAPY DIAG:  Other low back pain  Neuralgia and neuritis  ONSET DATE: since pt was 23 years old.   SUBJECTIVE:                                                                                                                                                                                           PERTINENT HISTORY:  Patient is a 23 y.o. male who presents to outpatient physical therapy with a referral for medical diagnosis lumbar radiculitis with instructions to see this particular PT for assessment of MDT evaluation and treatment. This patient's chief complaints consist of low back pain that radiates around his hips and glutes and  down the back of his thighs, leading to the following functional deficits: difficulty with all aspects of mobility, ADLs, IADLs, work, school, sports, and leisure. Relevant past medical history and comorbidities include the following: he has Nasal injury, initial encounter; Mild intermittent asthma; and Acute URI on their problem list. he  has a past medical history of Asthma and H/O infectious mononucleosis (2021), learning disability, irritable bowel syndrome, bicipital tenosynovitis, peanut allergy, tree nut allergy. Patient denies hx of cancer, stroke, seizures, lung problems, heart problems, diabetes, unexplained weight loss, unexplained changes  in bowel or bladder problems, unexplained stumbling or dropping things, osteoporosis, and spinal surgery  Exercise history: In terms of his physical activity, for the last 3 years he has been weight lifting 5-6x/week. Since he has back problems, he has been very mindful to try not to utilize his back, so he avoids deadlifst and squats. He avoids putting pressure on his back. He thinks that has backfired on him so he has decreased strength in the area so tolerance to twisting his back os now very low. In highschool and earlier years he played [employer] and things like that, but there was no specific event that messed up his back. He would love to figure out a game plan because he is young.   SUBJECTIVE STATEMENT:  Patient states he is doing well today. He states he did a legs workout yesterday and it irritated his back but he was careful with it and it was better. He took tylenol  last night and today and he is feeling better. He did not try any of the HEP.   Strength training (push, pull, leg split):  Sets: 1-2 warm up 2 working (6-8 reps) 1 heavy (8-12 reps) 1 drop set if feeling okay  [employer] numbers:  Last 1RM: 235# in may (hurt back so now avoiding testing 1RMs) 205#x8 reps 225#x4-6 reps Had a huge flair in back pain so backed off Now doing: 75#DB  For 8-7 reps  Mon Chest, shoulders, and triceps  Tues  Back and biceps  Wed  Legs  Thursday rest  Friday chest and back  Saturday biceps/ticeps/shoulders  Sunday rest  Worse - Bending: Sometimes - Sitting: Sometimes - Rising: Sometimes - Standing: Sometimes - Walking: Sometimes - Lying: Sometimes - AM: Sometimes - As the Day Progresses: Sometimes - PM: Sometimes - When Still: Sometimes - On the Move: Sometimes  Better - Bending: Never - Sitting: Never - Standing: Never - Walking: Never - Lying: Never - AM: Never - As the Day Progresses: Never - PM: Never - When Still: Never - On the Move: Never  Sleep and Rest  Disturbed Sleep: Yes: when it's irritated  Sleeping Postures: he starts one way and ends up another way. Probably mostly between back and stomach. But when in pain must sleep on his back. Prone hurts because of the arch in his back.  Surface: soft to medium.  Medical History  SPECIFIC QUESTIONS - Cough: Yes, Sneeze: Yes, Strain: Yes (yes to all when back is irritated).  - Bladder/Bowel: Normal - Gait: Normal unless in flair and that time a month ago when [place]   PAIN: NPRS: Current: 6/10 middle lower back.   From initial PT evaluation on 08/23/24:  Best: 4/10, Worst: 10/10. Pain location: Central low back (worst) and moves from left to right. Will have pressure/pain on his hips and down to his hamstrings and has felt it in his quads before, does not radiate below knees. Sometimes he feels tingling in these areas.   Pain description: Excruciating, back will randomly feel like he is in a vice with knives at every corner, pressure, tightness and any wrong move is stabbing.  Aggravating factors: None, maybe sitting for an extended period (like driving from [place] to [place], 89-87 hours) because after that he will definitely notice it.  Relieving factors: pressing himself up from the chair feels better, unsure, has described  laying flat on back with a heating pad and taking pain killer type things or advil   or tylenol , sitting when he felt weak, not moving.    PRECAUTIONS: None  FALLS:  Has patient fallen in last 6 months? Did not ask  OCCUPATION: full time college student (finance)  PLOF: Independent, no limitations due to his back  PATIENT GOALS: He would love to not be in pain anymore, ever. He thinks that might not be possible, and if it is possible he expects it to take an extensive amount of time. He would like to be able to get to a place where if he is in in pain, he can manage it and get out of it and move on and being optimistic he would love to never have pain again.   OBJECTIVE  Myotome Testing Functional screen:  Single leg heel raise with hand held UE support:  About the same, left possibly a little worse.  Ankle Eversion (S1) R: 4+/5 L: 4+/5   More discomfort at the medial calcanial region on L Anterior Tibialis (L4-L5) R: 4+/5 L: 4+/5   More discomfort at the medial calcanial region on L Extensor Hallucis Longus (L5) R: 4/5 L: 4/5    Movement Loss Movement Loss Symptoms  Flexion min Fingers to top of feet (but allows knees to bend) Tightness in hamstrings  Extension mod Uncomfortable  Side Gliding R nil Pain on right  Side Gliding L nil Pain on left   Traction alleviation testing:  Standing traction allevation in  neutral, decreased pain slighly Hooklying Lumbar manual traction 12x10 seconds on/off  Increasing pain in right glute and posterior thigh half way through, that was worse with pulling (this pain stopped with discontinuation of pulling, pt did not report until end of 12 reps).  Lumbar AROM more sore afterwards and pain slightly elevated.   Repeated Motions Testing Test Movement Symptom During Symptom After Mechanical Response Key Functional Test                    REIL 1x10 increases worse, increased central low back pain    FISitt Left low back pain No worse     RFISitt No change A little better overall and improved with extension. But also just sore.            Pain in left low back (shot of pain when he sat down after REIL) Pain still elevated overall after repeated motions and traction.     TREATMENT                                                                                                                            Manual therapy: to reduce pain and tissue tension, improve range of motion, neuromodulation, in order to promote improved ability to complete functional activities. HOOKLYING Lumbar manual traction 12x10 seconds on/off  Increasing pain in right glute and posterior thigh half way through, that was worse with pulling.     Neuromuscular Re-education: a technique or exercise performed with the goal of improving the level  of communication between the body and the brain, such as for balance, motor control, muscle activation patterns, coordination, desensitization, quality of muscle contraction, proprioception, and/or kinesthetic sense needed for successful and safe completion of functional activities.  Myotomal testing (see above)  Hooklying pelvic tilt AROM in pain limited/free range 1x10 after finding pain limited range and learning how to perform Tactile cuing under lumbar spine  Hooklying PPT with lat pull over, focusing on feeling with low back arches and correcting it 1 rep AROM then several reps holding 15# DB in both hands Good form  Hooklying pelvic tilt AROM in pain limited/free range Many reps  Quadruped multifidi hip hikes with one knee on folded towel Learned to do each side with good form, challenging Cuing for TrA  contraction during motion   Quadruped Alternating hip extension with neutral to slight PPT, stepwise with knee lift toe slide to knee extension before/after lift off. No shifting.  1-2 each side with 5 second hold Cuing for form  Quadruped bird dog with neutral to slight PPT, stepwise with knee  lift + toe slide to knee extension + fist to chest before/after lift off/shoulder extension. No shifting.  1x2-3 each side with 5 second hold Cuing and demonstration of coordination. Challenging but able to perform well  Shaking Added to HEP  Therapeutic exercise: therapeutic exercises that incorporate ONE parameter at one or more areas of the body to centralize symptoms, develop strength and endurance, range of motion, and flexibility required for successful completion of functional activities. REIL (repeated lumbar extension in lying) 1x10 Pain overall increased slightly ROM less comfortable  RFISitt 1x10 with slight self overpressure Slightly better but still more sore than when started   Self-Care/Home Management Training: to educate patient in self care including ADL training, meal preparation, compensatory training, safety procedures/instructions, use of assistive technology devices or adaptive equipment.  Educated on how to perform prone lying in extension (prone on elbows) should he experience a flair in symptoms before next visit.  Educated on MDT/Mc[place]enzie, POC, other approaches, mechanical stressors and what he is sensitive to (load, nerve tension, flexion and extension - possibly multidirectional).   Pt required multimodal cuing for proper technique and to facilitate improved neuromuscular control, strength, range of motion, and functional ability resulting in improved performance and form.  PATIENT EDUCATION:  Education details: Exercise purpose/form. Self management techniques. Education on diagnosis, prognosis, POC, anatomy and physiology of current condition. Education on HEP (forgot to provide handout) Person educated: Patient Education method: Explanation, Actor cues, and Verbal cues Education comprehension: verbalized understanding, returned demonstration, and needs further education  HOME EXERCISE PROGRAM: Access Code: [email or web address] URL:  [email or web address] Date: 08/25/2024 Prepared by: Camie Cleverly  Exercises - Static Prone on Elbows  - 2 x daily - 5 minutes hold - Bird Dog  - 1 x daily - 3 sets - 5-10 reps - 5-10 seconds hold  ASSESSMENT:  CLINICAL IMPRESSION: Patient further assessed for MDT classification. At this point he is sensitive to flexion and extension without improvement or irritated enough symptoms to produce centralization. Classifying as MDT OTHER at this point and will attempt reclassification in the future if appropriate. Pt started on progression through lumbopelvic movement, exploring functional range, and stabilizing functional range. He was challenged by exercises to focus on stabilizing in neutral with non-shifting bird dog and this was added to HEP for practice at home. Patient appears to be sensitive to the following mechanical stressors: load (positive traction alleviation test), nerve tension, extension and flexion (but  possibly multidirectional at this point). Patient does appear to have some decreased myotomal strength on the L LE that is subtle but most pronounced with single leg heel raise (S1). He had increased soreness in the central low back by end of session and with testing and traction. Plan to review gym training routine next session and continue education about mechanical sensitives and how to modify activities to respect these and allow body to continue recovering. Plan to monitor symptoms, reflexes, myotomes, and nerve tension and progress exercises and lumbar strengthening as tolerated.   MDT provisional classification:  other Mechanical sensitivities: neural tension, load, extension and flexion  OBJECTIVE IMPAIRMENTS: decreased activity tolerance, decreased coordination, decreased endurance, decreased knowledge of condition, decreased mobility, difficulty walking, decreased ROM, decreased strength, increased muscle spasms, impaired flexibility, and pain.   ACTIVITY  LIMITATIONS: carrying, lifting, bending, sitting, standing, squatting, sleeping, stairs, transfers, bed mobility, dressing, and locomotion level  PARTICIPATION LIMITATIONS: meal prep, cleaning, laundry, interpersonal relationship, driving, community activity, occupation, school, and athletics and leisure  PERSONAL FACTORS: Age, Past/current experiences, and Time since onset of injury/illness/exacerbation are also affecting patient's functional outcome.   REHAB POTENTIAL: Good  CLINICAL DECISION MAKING: Evolving/moderate complexity  EVALUATION COMPLEXITY: Moderate   GOALS: Goals reviewed with patient? No  SHORT TERM GOALS: Target date: 09/06/2024  Patient will be independent with initial home exercise program for self-management of symptoms. Baseline: Initial HEP to be provided at visit 2 as appropriate (2024-09-13); Goal status: In progress   LONG TERM GOALS: Target date: 11/15/2024  Patient will be independent with a long-term home exercise program for self-management of symptoms.  Baseline: Initial HEP to be provided at visit 2 as appropriate (13-Sep-2024); Goal status: In progress  2.  Patient will demonstrate improved Modified Oswestry Disability Index (mODI) to equal or less than 10% to demonstrate improvement in overall condition and self-reported functional ability.  Baseline: 20% (09/13/24); Goal status: In progress  3.  Patient will demonstrate to complete barbell back squat and dead lift 1x10 reps at a moderately challenging (defined by patient) load with no increase in low back pain or symptoms for 24 hours afterwards to improve his ability to work out and perform athletic activities without disruption.  Baseline: avoids squats and dead lifts (09/13/2024); Goal status: In progress  4.  Patient will demonstrate the ability to abolish his pain following a flair to improve his ability to confidently participate in ADLs, IADLs, work/school responsibilities, and leisure  activities.   Baseline: unable to find a way to decrease pain once it is triggered besides waiting (2024/09/13); Goal status: In progress  5.  Patient will demonstrate improvement in Patient Specific Functional Scale (PSFS) of equal or greater than 8/10 points to reflect clinically significant improvement in patient's most valued functional activities. Baseline: to be measured at visit 2 as appropriate (09/13/2024); Goal status: In progress  6.  Patient will report NPRS equal or less than 3/10 during functional activities during the last 2 weeks to improve their abilitly to complete community, work and/or recreational activities with less limitation. Baseline: up to 10/10 (09-13-24); Goal status: In progress   PLAN:  PT FREQUENCY: 1-2x/week  PT DURATION: 12 weeks  PLANNED INTERVENTIONS: 97164- PT Re-evaluation, 97750- Physical Performance Testing, 97110-Therapeutic exercises, 97530- Therapeutic activity, [id number]- Neuromuscular re-education, 97535- Self Care, 02859- Manual therapy, [id number]- Electrical stimulation (unattended), 20560 (1-2 muscles), 20561 (3+ muscles)- Dry Needling, Patient/Family education, Cryotherapy, and Moist heat.  PLAN FOR NEXT SESSION: update HEP as appropriate, confirm MDT classification, reduce derangement,  maintain reduction, recover function, and educate on prophylaxis (over the course of the POC). establish directional preference if present, matched intervention/repeated motions for directional preference, education on directional preference/mechanical stressors and modifications of activities to avoid them, recover motor control and awareness of modifiers to mechanical sensitivities as appropriate, retrain motor patterns, regain ROM, improve strength and resilience needed for performing desired functional performance with appropriate ROM, strength, power, and endurance. Manual therapy/Dry Needling as needed.    Camie SAUNDERS. Juli, PT, DPT, Cert. MDT 08/25/24, 8:23 PM  [facility]  [facility] [address] [place], [place] [address] P: [phone] I F: [phone]

## 2024-08-25 ENCOUNTER — Encounter: Payer: Self-pay | Admitting: Physical Therapy

## 2024-08-25 ENCOUNTER — Ambulatory Visit: Admitting: Physical Therapy

## 2024-08-25 DIAGNOSIS — M5459 Other low back pain: Secondary | ICD-10-CM | POA: Diagnosis not present

## 2024-08-25 DIAGNOSIS — M792 Neuralgia and neuritis, unspecified: Secondary | ICD-10-CM

## 2024-08-30 ENCOUNTER — Encounter: Payer: Self-pay | Admitting: Physical Therapy

## 2024-08-30 ENCOUNTER — Ambulatory Visit: Admitting: Physical Therapy

## 2024-08-30 DIAGNOSIS — M5459 Other low back pain: Secondary | ICD-10-CM

## 2024-08-30 DIAGNOSIS — M792 Neuralgia and neuritis, unspecified: Secondary | ICD-10-CM

## 2024-08-30 NOTE — Therapy (Signed)
 OUTPATIENT PHYSICAL THERAPY TREATMENT   Patient Name: Steve Myers MRN: [id number] DOB:04/03/2001, 23 y.o., male Today's Date: 08/30/2024  END OF SESSION:  PT End of Session - 08/30/24 2044     Visit Number 3    Number of Visits 17    Date for Recertification  11/15/24    Authorization Type [employer] reporting period from 08/23/2024    Authorization Time Period VL: 60 PT/OT/ST - 58 remain    Authorization - Number of Visits 58    Progress Note Due on Visit 10    PT Start Time 1602    PT Stop Time 1645    PT Time Calculation (min) 43 min    Activity Tolerance Patient tolerated treatment well;Patient limited by pain    Behavior During Therapy [facility] for tasks assessed/performed            Past Medical History:  Diagnosis Date   Asthma    H/O infectious mononucleosis 2021   History reviewed. No pertinent surgical history. Patient Active Problem List   Diagnosis Date Noted   Nasal injury, initial encounter 08/06/2022   Mild intermittent asthma 08/06/2022   Acute URI 08/06/2022    PCP: Lauraine PHEBE Fairy, MD  REFERRING PROVIDER: Robby Epley, DO [facility])  REFERRING DIAG: lumbar radiculitis  Rationale for Evaluation and Treatment: Rehabilitation  THERAPY DIAG:  Other low back pain  Neuralgia and neuritis  ONSET DATE: since pt was 23 years old.   SUBJECTIVE:                                                                                                                                                                                           PERTINENT HISTORY:  Patient is a 23 y.o. male who presents to outpatient physical therapy with a referral for medical diagnosis lumbar radiculitis with instructions to see this particular PT for assessment of MDT evaluation and treatment. This patient's chief complaints consist of low back pain that radiates around his hips and glutes and down the back of his thighs, leading  to the following functional deficits: difficulty with all aspects of mobility, ADLs, IADLs, work, school, sports, and leisure. Relevant past medical history and comorbidities include the following: he has Nasal injury, initial encounter; Mild intermittent asthma; and Acute URI on their problem list. he  has a past medical history of Asthma and H/O infectious mononucleosis (2021), learning disability, irritable bowel syndrome, bicipital tenosynovitis, peanut allergy, tree nut allergy. Patient denies hx of cancer, stroke, seizures, lung problems, heart problems, diabetes, unexplained weight loss, unexplained changes in bowel or bladder problems, unexplained stumbling  or dropping things, osteoporosis, and spinal surgery  Exercise history: In terms of his physical activity, for the last 3 years he has been weight lifting 5-6x/week. Since he has back problems, he has been very mindful to try not to utilize his back, so he avoids deadlifst and squats. He avoids putting pressure on his back. He thinks that has backfired on him so he has decreased strength in the area so tolerance to twisting his back os now very low. In highschool and earlier years he played [employer] and things like that, but there was no specific event that messed up his back. He would love to figure out a game plan because he is young.   SUBJECTIVE STATEMENT:  After last PT session his ankles were really sore, so that was a problem. He had a few flair ups (all related to bench press). Pateint states his first flair up was Thursday, he first notices rigth as he started to bench (lifted the bar a few times). He got off the bench and on the ground to do 10 prone press ups (no difference). He states directly after that he did some pull-ups and hung there and he thinks that helped. He thinks he did prone press ups once during the rest of the workout that day and then 1x10 at the end of the workout (but they did not help). He also some standing side bends  with a PVC bar overhead (which always feels good). He completed his workout as normal. He felt like was getting worse as he was continuing. 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 He did 135# for 10 reps. 155# x 10, 185 x 6 (felt good), then 205# x 6 and mid way through he completely felt his lower back tense up and it was super tight and every time he would press up the bar he would feel his back. Then he stopped benching for the day. As far as form, He has been focusing more on pressing his hips back to keep his back flat on the bench. He noticed he was starting to just arch his back at the heavier reps. He has been trying to avoid arching it by using dumbbells. He holds his feet in the air like a reverse sit up to get positioned. He was using the bar today instead of dumbbels. His workout was chest and triceps. He is really tired after being very busy up till now. He got up at 5:30am and he just got done working out and using the sauna. He states he has not tried to do his HEP. He states it was hard to do it when his ankles were in pain.    Strength training (push, pull, leg split):  Sets: 1-2 warm up 2 working (6-8 reps) 1 heavy (8-12 reps) 1 drop set if feeling okay  [employer] numbers:  Last 1RM: 235# in may (hurt back so now avoiding testing 1RMs) 205#x8 reps 225#x4-6 reps Had a huge flair in back pain so backed off Now doing: 75#DB For 8-7 reps  Mon Chest, shoulders, and triceps  Tues  Back and biceps  Wed  Legs  Thursday rest  Friday chest and back  Saturday biceps/ticeps/shoulders  Sunday rest  PAIN: NPRS: Current: 7/10 lower left to middle a good 7/10 where I can still do things  From initial PT evaluation on 08/23/24:  Best: 4/10, Worst: 10/10. Pain location: Central low back (worst) and moves from left  to right. Will have pressure/pain on his hips and down to his hamstrings and has felt it in his quads before, does not radiate below  knees. Sometimes he feels tingling in these areas.   Pain description: Excruciating, back will randomly feel like he is in a vice with knives at every corner, pressure, tightness and any wrong move is stabbing.  Aggravating factors: None, maybe sitting for an extended period (like driving from [place] to [place], 89-87 hours) because after that he will definitely notice it.  Relieving factors: pressing himself up from the chair feels better, unsure, has described laying flat on back with a heating pad and taking pain killer type things or advil  or tylenol , sitting when he felt weak, not moving.    PRECAUTIONS: None  FALLS:  Has patient fallen in last 6 months? Did not ask  OCCUPATION: full time college student (finance)  PLOF: Independent, no limitations due to his back  PATIENT GOALS: He would love to not be in pain anymore, ever. He thinks that might not be possible, and if it is possible he expects it to take an extensive amount of time. He would like to be able to get to a place where if he is in in pain, he can manage it and get out of it and move on and being optimistic he would love to never have pain again.   OBJECTIVE  Movement Loss (slightly more on the left and over sacrum) Movement Loss Symptoms  Flexion min Fingers to top of feet (but allows knees to bend) Tightness in hamstrings  Extension mod Radiates out to the sides, L > R, and radiates towards sacrum  Side Gliding R nil Pain on right  Side Gliding L nil Pain on left   CPA at L5 most painful, but tender at L4 and L3 as well  Repeated Motions Testing Test Movement Symptom During Symptom After Mechanical Response Key Functional Test  REIL1x10 increased Center plus left    REIL with self OP and hands on yoga blocks 1x5      REIL with clinican OP (2 x at L5, 3x at L4) L5 produced pain to left glute/thigh each rep. L4 increased prior pain Increased left sided pain slightly  Increased pain with extension                              TREATMENT                                                                                                                            Manual therapy: to reduce pain and tissue tension, improve range of motion, neuromodulation, in order to promote improved ability to complete functional activities. Prone lumbar extension in lying with clinician OP (see above) CPA at lumbar spine to determine most tender level and set up for overpressure    Neuromuscular Re-education: a technique or exercise performed with the  goal of improving the level of communication between the body and the brain, such as for balance, motor control, muscle activation patterns, coordination, desensitization, quality of muscle contraction, proprioception, and/or kinesthetic sense needed for successful and safe completion of functional activities.   Quadruped bird dog with neutral to slight PPT, stepwise with knee lift + toe slide to knee extension + fist to chest before/after lift off/shoulder extension. No shifting.  1x2-3 each side with 5 second hold Really hard Needed heavy cuing Aggreed to regression when offered  Quadruped Alternating hip extension with neutral to slight PPT, stepwise with knee lift toe slide to knee extension before/after lift off. No shifting.  3-4 each side with 5 second hold Cuing for form Stopped immediately when PT asked questions about bench press and other exercises Verbally updated HEP to include this instead of bird dogs  Therapeutic exercise: therapeutic exercises that incorporate ONE parameter at one or more areas of the body to centralize symptoms, develop strength and endurance, range of motion, and flexibility required for successful completion of functional activities. REIL variations without clinician OP (see objective) ROM checks   Therapeutic activities: dynamic therapeutic activities incorporating MULTIPLE parameters or areas of the body designed to achieve improved functional  performance. Bench press (hooks 10th hole from the bottom) Pt demonstrated his technique with the BB loaded to 95# Worked on improving shoulder/hand position on bar Discussed brace/lack of brace   Pt required multimodal cuing for proper technique and to facilitate improved neuromuscular control, strength, range of motion, and functional ability resulting in improved performance and form.  PATIENT EDUCATION:  Education details: Exercise purpose/form. Self management techniques. Education on diagnosis, prognosis, POC, anatomy and physiology of current condition. Education on HEP (forgot to provide handout) Person educated: Patient Education method: Explanation, Actor cues, and Verbal cues Education comprehension: verbalized understanding, returned demonstration, and needs further education  HOME EXERCISE PROGRAM: Access Code: [email or web address] URL: [email or web address] Date: 08/25/2024 Prepared by: Camie Cleverly  Exercises - Static Prone on Elbows  - 2 x daily - 5 minutes hold - Bird Dog  - 1 x daily - 3 sets - 5-10 reps - 5-10 seconds hold  ASSESSMENT:  CLINICAL IMPRESSION: Patient arrives with pain slightly off center to the left so repeated extension re-tested. No centralization despite progressions of force. Patient did not remember how to perform HEP and it needed to be regressed due to how challenging it was. Started looking at bench press since he seems to be having most of his pain flairs from this. It may be related to bracing or lack thereof for lifting as well as this exercise requires more intra-abdominal pressure to stabilize the spine than some of the other exercises he decscribes (since he is avoiding squat and dead lift). Pt with multiple things to clean up in his for and plan to work on teaching him bracing techniques and points of performance for bench press next session. He reported some increased soreness in the left lower back region by end of session. Patient  would benefit from continued management of limiting condition by skilled physical therapist to address remaining impairments and functional limitations to work towards stated goals and return to PLOF or maximal functional independence.   MDT provisional classification:  other Mechanical sensitivities: neural tension, load, extension and flexion  OBJECTIVE IMPAIRMENTS: decreased activity tolerance, decreased coordination, decreased endurance, decreased knowledge of condition, decreased mobility, difficulty walking, decreased ROM, decreased strength, increased muscle spasms, impaired flexibility, and pain.   ACTIVITY LIMITATIONS: carrying, lifting, bending,  sitting, standing, squatting, sleeping, stairs, transfers, bed mobility, dressing, and locomotion level  PARTICIPATION LIMITATIONS: meal prep, cleaning, laundry, interpersonal relationship, driving, community activity, occupation, school, and athletics and leisure  PERSONAL FACTORS: Age, Past/current experiences, and Time since onset of injury/illness/exacerbation are also affecting patient's functional outcome.   REHAB POTENTIAL: Good  CLINICAL DECISION MAKING: Evolving/moderate complexity  EVALUATION COMPLEXITY: Moderate   GOALS: Goals reviewed with patient? No  SHORT TERM GOALS: Target date: 09/06/2024  Patient will be independent with initial home exercise program for self-management of symptoms. Baseline: Initial HEP to be provided at visit 2 as appropriate (August 31, 2024); Goal status: In progress   LONG TERM GOALS: Target date: 11/15/2024  Patient will be independent with a long-term home exercise program for self-management of symptoms.  Baseline: Initial HEP to be provided at visit 2 as appropriate (08-31-2024); Goal status: In progress  2.  Patient will demonstrate improved Modified Oswestry Disability Index (mODI) to equal or less than 10% to demonstrate improvement in overall condition and self-reported functional ability.   Baseline: 20% (08/31/2024); Goal status: In progress  3.  Patient will demonstrate to complete barbell back squat and dead lift 1x10 reps at a moderately challenging (defined by patient) load with no increase in low back pain or symptoms for 24 hours afterwards to improve his ability to work out and perform athletic activities without disruption.  Baseline: avoids squats and dead lifts (08-31-24); Goal status: In progress  4.  Patient will demonstrate the ability to abolish his pain following a flair to improve his ability to confidently participate in ADLs, IADLs, work/school responsibilities, and leisure activities.   Baseline: unable to find a way to decrease pain once it is triggered besides waiting (08/31/2024); Goal status: In progress  5.  Patient will demonstrate improvement in Patient Specific Functional Scale (PSFS) of equal or greater than 8/10 points to reflect clinically significant improvement in patient's most valued functional activities. Baseline: to be measured at visit 2 as appropriate (08/31/2024); Goal status: In progress  6.  Patient will report NPRS equal or less than 3/10 during functional activities during the last 2 weeks to improve their abilitly to complete community, work and/or recreational activities with less limitation. Baseline: up to 10/10 (31-Aug-2024); Goal status: In progress   PLAN:  PT FREQUENCY: 1-2x/week  PT DURATION: 12 weeks  PLANNED INTERVENTIONS: 97164- PT Re-evaluation, 97750- Physical Performance Testing, 97110-Therapeutic exercises, 97530- Therapeutic activity, [id number]- Neuromuscular re-education, 97535- Self Care, 02859- Manual therapy, G0283- Electrical stimulation (unattended), 20560 (1-2 muscles), 20561 (3+ muscles)- Dry Needling, Patient/Family education, Cryotherapy, and Moist heat.  PLAN FOR NEXT SESSION: update HEP as appropriate, confirm MDT classification, reduce derangement, maintain reduction, recover function, and educate on  prophylaxis (over the course of the POC). establish directional preference if present, matched intervention/repeated motions for directional preference, education on directional preference/mechanical stressors and modifications of activities to avoid them, recover motor control and awareness of modifiers to mechanical sensitivities as appropriate, retrain motor patterns, regain ROM, improve strength and resilience needed for performing desired functional performance with appropriate ROM, strength, power, and endurance. Manual therapy/Dry Needling as needed.    Camie SAUNDERS. Juli, PT, DPT, Cert. MDT 08/30/24, 8:46 PM  [facility] [address] [place], [place] [address] P: [phone] I F: [phone]

## 2024-09-01 ENCOUNTER — Ambulatory Visit: Admitting: Physical Therapy

## 2024-09-01 ENCOUNTER — Telehealth: Payer: Self-pay | Admitting: Physical Therapy

## 2024-09-01 NOTE — Telephone Encounter (Signed)
 LVM notifying patient of missed PT visit scheduled at 4:45pm today. Requested they call back to reschedule, confirm next appointment, or let us  know of any changes in PT plans. Let patient know that with any no-show I am required to review our cancellation policy that after 2 no-shows we remove future visits from the schedule, they are responsible to calling in to reschedule, and they will only be able to schedule one appointment at a time and/or we may remove a patient from the schedule.   Camie SAUNDERS. Juli, PT, DPT 09/01/24, 5:06 PM  [facility] [address] [place], [place] [address] P: [phone] I F: [phone]

## 2024-09-06 ENCOUNTER — Ambulatory Visit: Admitting: Physical Therapy

## 2024-09-08 ENCOUNTER — Encounter: Payer: Self-pay | Admitting: Adult Health

## 2024-09-12 ENCOUNTER — Ambulatory Visit: Admitting: Adult Health

## 2024-09-12 ENCOUNTER — Encounter: Payer: Self-pay | Admitting: Adult Health

## 2024-09-12 VITALS — HR 78 | Temp 98.7°F

## 2024-09-12 DIAGNOSIS — R300 Dysuria: Secondary | ICD-10-CM

## 2024-09-12 NOTE — Progress Notes (Signed)
 [facility] [address] [place], [place] [address] Phone: [phone] Fax: [phone]   Office Visit Note  Patient Name: Steve Myers [facility]  Date of Birth:06-May-2001  Med Rec number [id number]  Date of Service: 09/12/2024  Armenta sousa (malabar nut tree) [justicia adhatoda] and Peanut-containing drug products  Chief Complaint  Patient presents with   Acute Visit     HPI  Patient is a well appearing 23yo male.  He reports pressure and tightness in his Pelvis and some burning/tingling at the urethral meatus. He describes post ejaculation he will have burning/pressure and the need to urinate.  Then he is unable to go.  He reports noticing this before summer.  Current Medication:  Outpatient Encounter Medications as of 09/12/2024  Medication Sig   albuterol  (VENTOLIN  HFA) 108 (90 Base) MCG/ACT inhaler Inhale 2 puffs into the lungs every 6 (six) hours as needed for wheezing or shortness of breath.   amoxicillin -clavulanate (AUGMENTIN ) 875-125 MG tablet Take 1 tablet by mouth 2 (two) times daily. (Patient not taking: Reported on 03/16/2024)   cetirizine (ZYRTEC) 10 MG tablet Take 10 mg by mouth daily.   diclofenac  (VOLTAREN ) 75 MG EC tablet Take 1 tablet (75 mg total) by mouth 2 (two) times daily.   doxycycline  (VIBRA -TABS) 100 MG tablet Take 1 tablet (100 mg total) by mouth daily.   fluconazole  (DIFLUCAN ) 150 MG tablet Take one tablet by mouth today, and then may repeat in 5 days if symptoms persist. (Patient not taking: Reported on 10/27/2023)   mebendazole  (VERMOX ) 100 MG chewable tablet Chew 1 tablet (100 mg total) by mouth 2 (two) times daily.   tamsulosin  (FLOMAX ) 0.4 MG CAPS capsule Take 1 capsule (0.4 mg total) by mouth daily.   No facility-administered encounter medications on file as of 09/12/2024.      Medical History: Past Medical History:  Diagnosis Date   Asthma    H/O infectious mononucleosis 2021     Vital Signs: Pulse 78   Temp  98.7 F (37.1 C) (Tympanic)   SpO2 99%    Review of Systems  Genitourinary:  Positive for dysuria and frequency. Negative for flank pain, hematuria, penile pain, scrotal swelling and testicular pain.    Physical Exam Vitals reviewed.  Constitutional:      Appearance: Normal appearance.  Genitourinary:    Penis: Normal.      Testes: Normal.  Neurological:     Mental Status: He is alert.    Assessment/Plan: 1. Dysuria (Primary) Discussed available testing options.  Patient elected for Gonorrhea, Chlamydia, Trichomonas testing today.  Patient will be contacted via MyChart when labs results are available.  Encouraged consistent condom use, as well as regular testing (every 3-6 months) when changing sex partners, or when having multiple partners.    - Chlamydia/Gonococcus/Trichomonas, NAA - Genital Mycoplasmas NAA, Urine     General Counseling: Joshia verbalizes understanding of the findings of todays visit and agrees with plan of treatment. I have discussed any further diagnostic evaluation that may be needed or ordered today. We also reviewed his medications today. he has been encouraged to call the office with any questions or concerns that should arise related to todays visit.   Orders Placed This Encounter  Procedures   Chlamydia/Gonococcus/Trichomonas, NAA   Genital Mycoplasmas NAA, Urine    No orders of the defined types were placed in this encounter.   Time spent:15 Minutes Time spent includes review of chart, medications, test results, and follow up plan with the patient.  Juliene DOROTHA Howells AGNP-C Nurse Practitioner

## 2024-09-13 LAB — CHLAMYDIA/GONOCOCCUS/TRICHOMONAS, NAA
Chlamydia by NAA: NEGATIVE
Gonococcus by NAA: NEGATIVE
Trich vag by NAA: NEGATIVE

## 2024-09-14 LAB — SPECIMEN STATUS REPORT

## 2024-09-18 LAB — GENITAL MYCOPLASMAS NAA, URINE
Mycoplasma genitalium NAA: NEGATIVE
Mycoplasma hominis NAA: NEGATIVE
Ureaplasma spp NAA: NEGATIVE

## 2024-09-19 ENCOUNTER — Other Ambulatory Visit: Payer: Self-pay | Admitting: Adult Health

## 2024-09-19 ENCOUNTER — Telehealth: Payer: Self-pay | Admitting: Adult Health

## 2024-09-19 DIAGNOSIS — R300 Dysuria: Secondary | ICD-10-CM

## 2024-09-19 NOTE — Telephone Encounter (Addendum)
 Symptoms started in May, after ejactuation had urgency to void. When attempted to void, had a large amount of pain and pressure in from bladder to tip of penis.  Voided, returned to bed, continues to get urgency for the next hour. Took an AZO, woke up symptoms were gone.   Happened a few times over the summer, always accompanying with intercourse and masturbation, but not with every ejactuation. Symptoms have worsened over last two weeks now outside of ejaculation.  Will void a normal void. A few minutes later will get urge to void. Some urethral burning with pressure, and straining at the end of the stream.    Has noticed lower amount of ejaculate and a decrease in explosive feeling with orgasm.  Pain after ejaculation: from the tightness, burning pressure. From base of penis/bladder. The voiding pain is more pressure internally.        In [place], contacted his health center. Urine sample given, STI panel negative. Microplasma negative. UTI neg. Health center mentioned starting on Doxy, pt is going on a trip on Wednesday and wanted to wait for our recommendation.    Lifts 5 days a week every week. Takes tylenol  or advil , but mostly take tylenol  (for back disc issues). Hydrates very well.   Has appt with local urologist on 10/28.  30 minutes dedicated to call  ----- Message from Davi'tshea Singletary sent at 09/19/2024  1:14 PM EDT ----- Regarding: PAC-MD Thedora Schneider- Clinical Issues / provider request Contact: [phone] CALLER NAME AND/OR DEPARTMENT: Steve Myers, Steve Myers [[phone]]   PROVIDER: MD Thedora Schneider   MESSAGE FOR: MD Thedora Schneider / staff    MESSAGE: Patient is requesting callback to discuss current symptoms . Please assist , thank you.      Note: Patient is currently in college in [place]  , patient did not want to go in detail is requesting to speak with provider .      REPEAT CALLER: No    CALL BACK NUMBER IF NOT PATIENT:  [phone]

## 2024-09-19 NOTE — Telephone Encounter (Signed)
Urine dipped.

## 2024-09-20 ENCOUNTER — Encounter: Admitting: Physical Therapy

## 2024-09-26 ENCOUNTER — Encounter: Admitting: Physical Therapy

## 2024-09-28 ENCOUNTER — Emergency Department

## 2024-09-28 ENCOUNTER — Encounter: Admitting: Physical Therapy

## 2024-09-28 ENCOUNTER — Other Ambulatory Visit: Payer: Self-pay

## 2024-09-28 ENCOUNTER — Ambulatory Visit: Admitting: Urology

## 2024-09-28 ENCOUNTER — Emergency Department
Admission: EM | Admit: 2024-09-28 | Discharge: 2024-09-28 | Disposition: A | Discharge status: Home / Self Care | Attending: Emergency Medicine | Admitting: Emergency Medicine

## 2024-09-28 DIAGNOSIS — R519 Headache, unspecified: Secondary | ICD-10-CM | POA: Insufficient documentation

## 2024-09-28 DIAGNOSIS — R040 Epistaxis: Secondary | ICD-10-CM | POA: Diagnosis present

## 2024-09-28 DIAGNOSIS — R300 Dysuria: Secondary | ICD-10-CM | POA: Insufficient documentation

## 2024-09-28 MED ORDER — OXYMETAZOLINE HCL 0.05 % NA SOLN
1.0000 | Freq: Once | NASAL | Status: AC
  Administered 2024-09-28: 1 via NASAL
  Filled 2024-09-28: qty 30

## 2024-09-28 NOTE — ED Notes (Signed)
 Breathing easy/comfortably, no distress. Speech clear. Understands dc instructions:no extrenuous exercises, cold pack to face. Gait steady,denies nausea.
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
## 2024-09-28 NOTE — Assessment & Plan Note (Signed)
 Persistent dysuria, ejaculatory discomfort, perineal pressure Neg STI, mycoplasma, UA   Possible ongoing inflammatory urethritis vs. inflammatory prostatitis Less likely urethral pathology, stricture from prior chlamydial infection  - Recommend 4-week respite from sexual activity - 21-day course of Mobic 7.5 mg twice daily.  Pause on any other OTC NSAIDs or prescribed diclofenac  - RTC in ~4-6 weeks for symptom check

## 2024-09-28 NOTE — ED Provider Notes (Signed)
 [facility] Provider Note    Event Date/Time   First MD Initiated Contact with Patient 09/28/24 [phone]     (approximate)   History   Epistaxis   HPI  Steve Myers is a 23 y.o. male   Past medical history of healthy young man here for nosebleed.  He struck his nose on another individual's head earlier in the night and developed a bilateral nosebleed.  It has stopped with a clamp.  He has had a mild headache.  He did not lose consciousness.  He does not take blood thinner.  He has sensitivity to bright light.  He wonders if he has a concussion.  He wonders if he broke his nose.  He has no other medical complaints.  External Medical Documents Reviewed: Outpatient notes      Physical Exam   Triage Vital Signs: ED Triage Vitals  Encounter Vitals Group     BP 09/28/24 0149 (!) 148/81     Girls Systolic BP Percentile --      Girls Diastolic BP Percentile --      Boys Systolic BP Percentile --      Boys Diastolic BP Percentile --      Pulse Rate 09/28/24 0149 88     Resp 09/28/24 0149 18     Temp 09/28/24 0149 98.5 F (36.9 C)     Temp src --      SpO2 09/28/24 0149 100 %     Weight 09/28/24 0148 180 lb (81.6 kg)     Height 09/28/24 0148 6' 5 (1.956 m)     Head Circumference --      Peak Flow --      Pain Score 09/28/24 0148 8     Pain Loc --      Pain Education --      Exclude from Growth Chart --     Most recent vital signs: Vitals:   09/28/24 0149  BP: (!) 148/81  Pulse: 88  Resp: 18  Temp: 98.5 F (36.9 C)  SpO2: 100%    General: Awake, no distress.  CV:  Good peripheral perfusion.  Resp:  Normal effort.  Abd:  No distention.  Other:  No septal hematoma.  No bleeding.  Dried blood in bilateral nares.  No obvious deformity to the nose.  No other facial injury noted on external exam.  Neck supple full range of motion.   ED Results / Procedures / Treatments   Labs (all labs ordered are listed, but only abnormal  results are displayed) Labs Reviewed - No data to display     RADIOLOGY I independently reviewed and interpreted nasal bone x-ray and see no fracture I also reviewed radiologist's formal read.   PROCEDURES:  Critical Care performed: No  Procedures   MEDICATIONS ORDERED IN ED: Medications  oxymetazoline (AFRIN) 0.05 % nasal spray 1 spray (has no administration in time range)     IMPRESSION / MDM / ASSESSMENT AND PLAN / ED COURSE  I reviewed the triage vital signs and the nursing notes.                                Patient's presentation is most consistent with acute presentation with potential threat to life or bodily function.  Differential diagnosis includes, but is not limited to, nasal bone fracture, epistaxis, concussion MDM:    He injured in general and had nosebleed but  now it is hemostatic with pressure.  No obvious lesions or ongoing bleeding but will give him Afrin and a nose clamp and instruct on how to manage recurrent nosebleeds should be happen again at home.  X-ray of the nose shows no obvious broken bone.  No septal hematoma.  He wonders if he has a concussion with head injury and headache/light sensitivity he may be suffering from concussion.  However I highly doubt skull fracture or ICH, Canadian head CT rules very low risk defer advanced imaging.  Plan for discharge         FINAL CLINICAL IMPRESSION(S) / ED DIAGNOSES   Final diagnoses:  Epistaxis     Rx / DC Orders   ED Discharge Orders     None        Note:  This document was prepared using Dragon voice recognition software and may include unintentional dictation errors.    Cyrena Mylar, MD 09/28/24 [phone]

## 2024-09-28 NOTE — ED Notes (Signed)
 Patient denies pain and is resting comfortably.

## 2024-09-28 NOTE — Progress Notes (Unsigned)
   09/29/24 11:10 AM   Steve Myers Jul 28, 2001 [id number]   HPI: 23 y.o. male here for initial evaluation  Referred for ongoing dysuria: Burning and intense pain post ejaculation as well as urination. Was intermittent now more consistent. Neg G/C/T and Mycoplasma. Urine dip negative. Started in May, worse in the last week  PCP started a course of doxycycline  and empiric Flomax   Today, describes fairly consistent bouts of dysuria, perineal pressure, low ejaculate volume, discomfort after ejaculating Long-term girlfriend, monogamous He continues to have sexual activity which flares symptoms History of chlamydia 1-2 years ago, properly treated No prior GU condition  History of chronic back pain requiring epidural anesthesia last year, unclear etiology Takes intermittent oral diclofenac   He is a Holiday representative at [employer]   PMH: Past Medical History:  Diagnosis Date   Asthma    H/O infectious mononucleosis 2021    Surgical History: No past surgical history on file.  Family History: No family history on file.  Social History:  reports that he has never smoked. He has never used smokeless tobacco. He reports current alcohol use. He reports current drug use. Drug: Marijuana.      Physical Exam: BP 123/79   Pulse 80   Ht 6' 5 (1.956 m)   Wt 184 lb (83.5 kg)   BMI 21.82 kg/m    Constitutional:  Alert and oriented, No acute distress. Cardiovascular: No clubbing, cyanosis, or edema. Respiratory: Normal respiratory effort, no increased work of breathing. GI: Nondistended GU: Circumcised phallus, normal penoscrotal exam normal meatus Skin: No rashes, bruises or suspicious lesions. Neurologic: Grossly intact, no focal deficits, moving all 4 extremities. Psychiatric: Normal mood and affect.  Laboratory Data: Neg G/C/T and Mycoplasma. Urine dip negative.   Pertinent Imaging: N/A    Assessment & Plan:    Dysuria Assessment & Plan: Persistent dysuria,  ejaculatory discomfort, perineal pressure Neg STI, mycoplasma, UA   Possible ongoing inflammatory urethritis vs. inflammatory prostatitis Less likely urethral pathology, stricture from prior chlamydial infection  - Recommend 4-week respite from sexual activity - 21-day course of Mobic 7.5 mg twice daily.  Pause on any other OTC NSAIDs or prescribed diclofenac  - RTC in ~4-6 weeks for symptom check   Orders: -     Urinalysis, Complete  Other orders -     Meloxicam; Take 1 tablet (7.5 mg total) by mouth in the morning and at bedtime for 21 days.  Dispense: 42 tablet; Refill: 0      Steve Skye, MD 09/29/2024  [facility] [address] [place], [place] [address] [phone]

## 2024-09-28 NOTE — Discharge Instructions (Signed)
 If you have another nosebleed, blow your nose to dislodge clots.  Then use 2 sprays of Afrin to the affected nostril and apply the nasal clamp for 30 minutes.  If you continue to have bleed after that, come to the emergency department for reevaluation.  Your x-ray did not show a broken bone.  If you continue to have concerns about your nose with a healing process you can call Dr. Blair who is a nose specialist.  Take acetaminophen  650 mg and ibuprofen  400 mg every 6 hours for pain.  Take with food.   Thank you for choosing us  for your health care today!  Please see your primary doctor this week for a follow up appointment.   If you have any new, worsening, or unexpected symptoms call your doctor right away or come back to the emergency department for reevaluation.  It was my pleasure to care for you today.   Ginnie EDISON Cyrena, MD

## 2024-09-29 ENCOUNTER — Ambulatory Visit: Admitting: Urology

## 2024-09-29 VITALS — BP 123/79 | HR 80 | Ht 77.0 in | Wt 184.0 lb

## 2024-09-29 DIAGNOSIS — R300 Dysuria: Secondary | ICD-10-CM | POA: Diagnosis not present

## 2024-09-29 LAB — MICROSCOPIC EXAMINATION: Bacteria, UA: NONE SEEN

## 2024-09-29 LAB — URINALYSIS, COMPLETE
Bilirubin, UA: NEGATIVE
Glucose, UA: NEGATIVE
Ketones, UA: NEGATIVE
Leukocytes,UA: NEGATIVE
Nitrite, UA: NEGATIVE
Protein,UA: NEGATIVE
RBC, UA: NEGATIVE
Specific Gravity, UA: 1.015 (ref 1.005–1.030)
Urobilinogen, Ur: 0.2 mg/dL (ref 0.2–1.0)
pH, UA: 7 (ref 5.0–7.5)

## 2024-09-29 MED ORDER — MELOXICAM 7.5 MG PO TABS: 7.5000 mg | ORAL_TABLET | Freq: Two times a day (BID) | ORAL | 0 refills | Status: AC

## 2024-10-04 ENCOUNTER — Ambulatory Visit: Admitting: Urology

## 2024-10-04 ENCOUNTER — Encounter: Admitting: Physical Therapy

## 2024-10-06 ENCOUNTER — Encounter: Admitting: Physical Therapy

## 2024-10-11 ENCOUNTER — Encounter: Admitting: Physical Therapy

## 2024-10-13 ENCOUNTER — Encounter: Admitting: Physical Therapy

## 2024-10-18 ENCOUNTER — Encounter: Admitting: Physical Therapy

## 2024-10-24 ENCOUNTER — Encounter: Admitting: Physical Therapy

## 2024-10-26 ENCOUNTER — Encounter: Admitting: Physical Therapy

## 2024-10-26 ENCOUNTER — Ambulatory Visit (INDEPENDENT_AMBULATORY_CARE_PROVIDER_SITE_OTHER): Admitting: Physician Assistant

## 2024-10-26 VITALS — BP 106/74 | HR 67

## 2024-10-26 DIAGNOSIS — R21 Rash and other nonspecific skin eruption: Secondary | ICD-10-CM | POA: Diagnosis not present

## 2024-10-26 DIAGNOSIS — R300 Dysuria: Secondary | ICD-10-CM

## 2024-10-26 NOTE — Progress Notes (Unsigned)
 10/26/2024 12:16 PM   Steve Myers [birth year] [id number]  CC: No chief complaint on file.  HPI: Steve Myers is a 23 y.o. male with PMH dysuria with negative STI testing and treated chlamydia who presents today for follow-up.   He saw Dr. Georganne in clinic on 09/29/2024 for evaluation of his dysuria.  He was treated with 21 days of Mobic and advised to refrain from sexual activity for 4 weeks for management of likely inflammatory urethritis versus prostatitis.  He subsequently messaged with concerns about redness on the glans penis after having sex with his girlfriend.  Today he reports he completed Mobic as prescribed, but was unable to refrain from sex for 4 weeks.  He is voiding symptoms are about 85% better, however he still is having some bothersome urgency.  He also describes dribbling of urine and semen after voiding and ejaculation.  He feels that these fluids are collecting at the tip of his penis.  He also notes that when he applies perineal pressure, there will be additional drainage per urethra.  With regard to the redness on his glans penis, he states this occurred several times only after having sex with his girlfriend.  It has never occurred with masturbation.  It tends to resolve within several hours.  The redness is accompanied by a stinging sensation.  His girlfriend does have an IUD in place.  They are no longer together.  Per history, it sounds like he has had urodynamics in [place] .  He also describes a history of prostatitis.  PMH: Past Medical History:  Diagnosis Date   Asthma    H/O infectious mononucleosis 2021    Surgical History: No past surgical history on file.  Home Medications:  Allergies as of 10/26/2024       Reactions   Justicia Adhatoda (malabar Nut Tree) [justicia Adhatoda] Anaphylaxis   Peanut-containing Drug Products Anaphylaxis        Medication List        Accurate as of October 26, 2024 12:16 PM. If you  have any questions, ask your nurse or doctor.          albuterol  108 (90 Base) MCG/ACT inhaler Commonly known as: VENTOLIN  HFA Inhale 2 puffs into the lungs every 6 (six) hours as needed for wheezing or shortness of breath.   cetirizine 10 MG tablet Commonly known as: ZYRTEC Take 10 mg by mouth daily.   diclofenac  75 MG EC tablet Commonly known as: VOLTAREN  Take 1 tablet (75 mg total) by mouth 2 (two) times daily.   doxycycline  100 MG tablet Commonly known as: VIBRA -TABS Take 1 tablet (100 mg total) by mouth daily.   erythromycin ophthalmic ointment 3 (three) times daily.   fluconazole  150 MG tablet Commonly known as: DIFLUCAN  Take one tablet by mouth today, and then may repeat in 5 days if symptoms persist.   mebendazole  100 MG chewable tablet Commonly known as: VERMOX  Chew 1 tablet (100 mg total) by mouth 2 (two) times daily.   tamsulosin  0.4 MG Caps capsule Commonly known as: FLOMAX  Take 1 capsule (0.4 mg total) by mouth daily.        Allergies:  Allergies  Allergen Reactions   Justicia Adhatoda (Malabar Nut Tree) [Justicia Adhatoda] Anaphylaxis   Peanut-Containing Drug Products Anaphylaxis    Family History: No family history on file.  Social History:   reports that he has never smoked. He has never used smokeless tobacco. He reports current alcohol use. He reports current drug  use. Drug: Marijuana.  Physical Exam: BP 106/74   Pulse 67   Constitutional:  Alert and oriented, no acute distress, nontoxic appearing HEENT: [place], AT Cardiovascular: No clubbing, cyanosis, or edema Respiratory: Normal respiratory effort, no increased work of breathing GU: Patent urethra, narrow meatus.  No redness of the glans. Skin: No rashes, bruises or suspicious lesions Neurologic: Grossly intact, no focal deficits, moving all 4 extremities Psychiatric: Normal mood and affect  Assessment & Plan:   1. Dysuria (Primary) Symptoms 85% improved after 3 weeks of  anti-inflammatories for likely inflammatory prostatitis versus urethritis.  He continues to describe what sounds like pooling of semen and urine in the urethra and his meatus does appear somewhat narrowed to me on physical exam today.  I question underlying stricture, especially given his history of chlamydia.  We had a lengthy conversation today about options.  I recommended cystoscopy for definitive evaluation of possible stricture.  He prefers to defer this.  He asked about less invasive alternatives, however unfortunately I do not have one to offer.  We discussed a trial of Flomax  to address any residual inflammatory prostatitis, but he would prefer to defer this due to concerns for retrograde ejaculation.  We discussed alfuzosin as an alternative that would be less likely to cause retrograde ejaculation, but he would like to defer this due to cost.  He asked about antibiotics, and I was very honest with him that I have extremely low suspicion for a bacterial etiology for his symptoms.  I am not confident that this will resolve his problems.  2. Penile rash At first glance at photos sent via MyChart, I thought this rash had a rather vesicular appearance, however its presentation only after penetrative sex with resolution within several hours is not consistent with HSV.  Using shared decision making, we elected to defer testing for this today.  I strongly suspect that his partner's IUD strings were poking him during sex and this is the source of the redness.  Return if symptoms worsen or fail to improve.  Lucie Hones, PA-C  [facility] [place] [address] [place] [facility] [place], [place] [address] [phone]

## 2024-11-01 ENCOUNTER — Encounter: Admitting: Physical Therapy

## 2024-11-02 ENCOUNTER — Encounter: Payer: Self-pay | Admitting: Adult Health

## 2024-11-07 ENCOUNTER — Encounter: Admitting: Physical Therapy

## 2024-11-10 ENCOUNTER — Ambulatory Visit: Admitting: Family Medicine

## 2024-11-10 ENCOUNTER — Other Ambulatory Visit: Payer: Self-pay

## 2024-11-10 ENCOUNTER — Encounter: Payer: Self-pay | Admitting: Family Medicine

## 2024-11-10 VITALS — BP 122/78 | HR 84 | Temp 97.3°F

## 2024-11-10 DIAGNOSIS — R300 Dysuria: Secondary | ICD-10-CM

## 2024-11-10 DIAGNOSIS — N5319 Other ejaculatory dysfunction: Secondary | ICD-10-CM

## 2024-11-10 DIAGNOSIS — G8929 Other chronic pain: Secondary | ICD-10-CM

## 2024-11-10 DIAGNOSIS — M792 Neuralgia and neuritis, unspecified: Secondary | ICD-10-CM

## 2024-11-10 NOTE — Progress Notes (Signed)
 Assessment & Plan:  Patient ID: Steve Myers, male    DOB: May 04, 2001  Age: 23 y.o. MRN: [id number]  Problem List Items Addressed This Visit       Other   Dysuria   Relevant Orders   POCT urinalysis dipstick   Other Visit Diagnoses       Abnormal ejaculation    -  Primary   Relevant Orders   CBC w/Diff   Comprehensive metabolic panel with GFR   TSH   Testosterone,Free and Total   Vitamin D (25 hydroxy)   Vitamin B12     Chronic bilateral low back pain, unspecified whether sciatica present       Relevant Orders   CBC w/Diff   Vitamin D (25 hydroxy)   Vitamin B12     Nerve pain       Relevant Orders   CBC w/Diff   Comprehensive metabolic panel with GFR   TSH   Testosterone,Free and Total   Vitamin D (25 hydroxy)   Vitamin B12      Assessment and Plan Assessment & Plan Lower urinary tract symptoms with ejaculatory dysfunction Intermittent symptoms since May. Partial relief with meloxicam . Differential includes urethritis. Urologist suggested cystoscopy. Discussed infections' impact on fertility. - Ordered CBC, thyroid function tests, testosterone level. - Advised trial of NSAID before sexual activity since mobic  seem to help, he is already Etodolac - Discussed trial of tamsulosin  if meloxicam  ineffective. Patient never picked up rx from urology - Encouraged follow-up with urologist for cystoscopy if symptoms persist. -? Kegel exercises , urology PT  Chronic low back pain with lumbar disc herniation Chronic pain with herniated discs at L4-L5. Previous epidural steroid injections. Possible connection to pelvic symptoms. - Continue anti-inflammatory medications as needed.     Follow-up: Return if symptoms worsen or fail to improve.    Subjective:    CC: The primary encounter diagnosis was Abnormal ejaculation. Diagnoses of Dysuria, Chronic bilateral low back pain, unspecified whether sciatica present, and Nerve pain were also pertinent to this  visit.  HPI  Discussed the use of AI scribe software for clinical note transcription with the patient, who gave verbal consent to proceed.  History of Present Illness Steve Myers is a 23 year old male who presents with urinary and ejaculatory issues following sexual activity.  Ejaculatory dysfunction - Onset in May with significant post-ejaculatory pain, initially relieved by Azo - Episodes increased in frequency, initially weekly, now also occurring during urination without sexual activity - Decreased ejaculatory pressure and volume since late August or September which worries him, concerns about future reproduction - Ejaculate falls out with reduced force - Burning urgency to urinate post-ejaculation with minimal urine output, lasting for hours - No recent sexual activity in the past month since broke up with girlfriend  Lower urinary tract symptoms - Sensation of incomplete bladder emptying - Burning urgency to urinate post-ejaculation with minimal urine output, lasting hours  Perineal and pelvic discomfort - Sensitivity in the perineal area  Musculoskeletal symptoms - History of significant back problems, including fibrosis and cortisone injections - Recent increase in back pain - Concern about possible relationship between back issues and current genitourinary symptoms  Infectious disease history and treatment - History of chlamydia, treated with doxycycline  which was really the onset of many of his issues - Treated for prostatitis - Took meloxicam  for suspected urethritis, with partial relief  Fertility concerns - Concern about impact of symptoms on future reproductive ability    History  Steve Myers has a past medical history of Asthma and H/O infectious mononucleosis (2021).   He has no past surgical history on file.   His family history is not on file.He reports that he has never smoked. He has never used smokeless tobacco. He reports current alcohol use.  He reports current drug use. Drug: Marijuana.  Outpatient Medications Prior to Visit  Medication Sig Dispense Refill   albuterol  (VENTOLIN  HFA) 108 (90 Base) MCG/ACT inhaler Inhale 2 puffs into the lungs every 6 (six) hours as needed for wheezing or shortness of breath. 8 g 2   cetirizine (ZYRTEC) 10 MG tablet Take 10 mg by mouth daily.     diclofenac  (VOLTAREN ) 75 MG EC tablet Take 1 tablet (75 mg total) by mouth 2 (two) times daily. 60 tablet 0   doxycycline  (VIBRA -TABS) 100 MG tablet Take 1 tablet (100 mg total) by mouth daily. 30 tablet 0   erythromycin ophthalmic ointment 3 (three) times daily.     fluconazole  (DIFLUCAN ) 150 MG tablet Take one tablet by mouth today, and then may repeat in 5 days if symptoms persist. (Patient not taking: Reported on 09/29/2024) 2 tablet 0   mebendazole  (VERMOX ) 100 MG chewable tablet Chew 1 tablet (100 mg total) by mouth 2 (two) times daily. 12 tablet 0   tamsulosin  (FLOMAX ) 0.4 MG CAPS capsule Take 1 capsule (0.4 mg total) by mouth daily. 10 capsule 0   No facility-administered medications prior to visit.    ROS per HPI ROS  Objective:  BP 122/78   Pulse 84   Temp (!) 97.3 F (36.3 C) (Tympanic)   SpO2 100%   Physical Exam GEN: A&O x 3, NAD, well nourished HEENT: Normocephalic, atraumatic, EOMI, OP patent, bilateral TMs normal, no LAD, no thyromegaly RESP: CTAB, no R/R/W CV: RRR, no M/R/G ABDOMEN: soft, nontender, normal BS, no HSM VAS: no Steve Myers swelling NEURO: A&O x 3 MSK: normal gait, normal ROM UE and Steve Myers PSYCH: normal speech, normal mood SKIN: no rashes or lesions   Dr. Jerel Jaynie Daniels ABFM University Physician   General Counseling: Steve Myers verbalizes understanding of the findings of today's visit and agrees with plan of treatment. I have discussed any further diagnostic evaluation that may be needed or ordered today. We also reviewed their medications and precautions needed today. They have been encouraged to mychart  message/call the office with any questions or concerns that should arise related to today's visit.

## 2024-11-11 ENCOUNTER — Encounter

## 2024-11-14 ENCOUNTER — Ambulatory Visit

## 2024-11-14 DIAGNOSIS — N5319 Other ejaculatory dysfunction: Secondary | ICD-10-CM

## 2024-11-14 DIAGNOSIS — M545 Low back pain, unspecified: Secondary | ICD-10-CM

## 2024-11-14 DIAGNOSIS — M792 Neuralgia and neuritis, unspecified: Secondary | ICD-10-CM

## 2024-11-14 NOTE — Progress Notes (Signed)
 Pt here for lab draw per Dr. Ladora. Pt not seen by provider this visit.

## 2024-11-16 ENCOUNTER — Ambulatory Visit: Payer: Self-pay | Admitting: Family Medicine

## 2024-11-16 LAB — TESTOSTERONE,FREE AND TOTAL
Testosterone, Free: 19.5 pg/mL (ref 9.3–26.5)
Testosterone: 881 ng/dL (ref 264–916)

## 2024-11-16 LAB — CBC WITH DIFFERENTIAL/PLATELET
Basophils Absolute: 0 x10E3/uL (ref 0.0–0.2)
Basos: 1 %
EOS (ABSOLUTE): 0.1 x10E3/uL (ref 0.0–0.4)
Eos: 2 %
Hematocrit: 48.1 % (ref 37.5–51.0)
Hemoglobin: 16 g/dL (ref 13.0–17.7)
Immature Grans (Abs): 0 x10E3/uL (ref 0.0–0.1)
Immature Granulocytes: 0 %
Lymphocytes Absolute: 2.4 x10E3/uL (ref 0.7–3.1)
Lymphs: 43 %
MCH: 30.9 pg (ref 26.6–33.0)
MCHC: 33.3 g/dL (ref 31.5–35.7)
MCV: 93 fL (ref 79–97)
Monocytes Absolute: 0.6 x10E3/uL (ref 0.1–0.9)
Monocytes: 10 %
Neutrophils Absolute: 2.4 x10E3/uL (ref 1.4–7.0)
Neutrophils: 44 %
Platelets: 235 x10E3/uL (ref 150–450)
RBC: 5.18 x10E6/uL (ref 4.14–5.80)
RDW: 12.3 % (ref 11.6–15.4)
WBC: 5.5 x10E3/uL (ref 3.4–10.8)

## 2024-11-16 LAB — VITAMIN B12: Vitamin B-12: 640 pg/mL (ref 232–1245)

## 2024-11-16 LAB — COMPREHENSIVE METABOLIC PANEL WITH GFR
ALT: 17 IU/L (ref 0–44)
AST: 19 IU/L (ref 0–40)
Albumin: 5 g/dL (ref 4.3–5.2)
Alkaline Phosphatase: 62 IU/L (ref 47–123)
BUN/Creatinine Ratio: 15 (ref 9–20)
BUN: 15 mg/dL (ref 6–20)
Bilirubin Total: 0.9 mg/dL (ref 0.0–1.2)
CO2: 25 mmol/L (ref 20–29)
Calcium: 9.8 mg/dL (ref 8.7–10.2)
Chloride: 101 mmol/L (ref 96–106)
Creatinine, Ser: 1.03 mg/dL (ref 0.76–1.27)
Globulin, Total: 2.3 g/dL (ref 1.5–4.5)
Glucose: 102 mg/dL — ABNORMAL HIGH (ref 70–99)
Potassium: 4.8 mmol/L (ref 3.5–5.2)
Sodium: 140 mmol/L (ref 134–144)
Total Protein: 7.3 g/dL (ref 6.0–8.5)
eGFR: 105 mL/min/1.73 (ref >59)

## 2024-11-16 LAB — TSH: TSH: 1.03 u[IU]/mL (ref 0.450–4.500)

## 2024-11-16 LAB — VITAMIN D 25 HYDROXY (VIT D DEFICIENCY, FRACTURES): Vit D, 25-Hydroxy: 31.2 ng/mL (ref 30.0–100.0)

## 2024-11-17 NOTE — Telephone Encounter (Signed)
 Called patient since he has not read MyChart message I sent replying to him. Patient said he was following up with his care to another urologist for other treatments options. I let patient know that he could call our office if he needed anything. Patient had no other questions or concerns.-Nazim Kadlec,CMA

## 2024-12-25 ENCOUNTER — Encounter: Admit: 2024-12-25 | Payer: PRIVATE HEALTH INSURANCE | Primary: Internal Medicine

## 2024-12-25 ENCOUNTER — Inpatient Hospital Stay: Admit: 2024-12-25 | Discharge: 2024-12-25 | Payer: Managed Care (Private)

## 2024-12-25 ENCOUNTER — Emergency Department: Admit: 2024-12-25 | Payer: Managed Care (Private) | Primary: Internal Medicine

## 2024-12-25 DIAGNOSIS — B349 Viral infection, unspecified: Secondary | ICD-10-CM

## 2024-12-25 DIAGNOSIS — Z91010 Allergy to peanuts: Secondary | ICD-10-CM

## 2024-12-25 DIAGNOSIS — T364X5A Adverse effect of tetracyclines, initial encounter: Secondary | ICD-10-CM

## 2024-12-25 DIAGNOSIS — Z8739 Personal history of other diseases of the musculoskeletal system and connective tissue: Secondary | ICD-10-CM

## 2024-12-25 DIAGNOSIS — R1012 Left upper quadrant pain: Secondary | ICD-10-CM

## 2024-12-25 DIAGNOSIS — M51369 Degenerative disc disease (DDD) of lumbar region without discogenic back pain or leg pain: Principal | ICD-10-CM

## 2024-12-25 DIAGNOSIS — M940 Chondrocostal junction syndrome [Tietze]: Principal | ICD-10-CM

## 2024-12-25 DIAGNOSIS — Z20822 Contact with and (suspected) exposure to covid-19: Secondary | ICD-10-CM

## 2024-12-25 DIAGNOSIS — K2960 Other gastritis without bleeding: Secondary | ICD-10-CM

## 2024-12-25 LAB — CBC WITH AUTO DIFFERENTIAL
BKR WAM ABSOLUTE IMMATURE GRANULOCYTES.: 0.01 x 1000/ÂµL (ref 0.00–0.30)
BKR WAM ABSOLUTE LYMPHOCYTE COUNT.: 1.74 x 1000/ÂµL (ref 0.60–3.70)
BKR WAM ABSOLUTE NRBC: 0 x 1000/ÂµL (ref 0.00–1.00)
BKR WAM ANC (ABSOLUTE NEUTROPHIL COUNT): 2.71 x 1000/ÂµL (ref 2.00–7.60)
BKR WAM BASOPHIL ABSOLUTE COUNT.: 0.04 x 1000/ÂµL (ref 0.00–1.00)
BKR WAM BASOPHILS: 0.8 % (ref 0.0–1.4)
BKR WAM EOSINOPHIL ABSOLUTE COUNT.: 0.08 x 1000/ÂµL (ref 0.00–1.00)
BKR WAM EOSINOPHILS: 1.6 % (ref 0.0–5.0)
BKR WAM HEMATOCRIT: 45.1 % (ref 38.50–50.00)
BKR WAM HEMOGLOBIN: 14.9 g/dL (ref 13.2–17.1)
BKR WAM IMMATURE GRANULOCYTES: 0.2 % (ref 0.0–1.0)
BKR WAM LYMPHOCYTES: 34.5 % (ref 17.0–50.0)
BKR WAM MCH: 30.3 pg (ref 27.0–33.0)
BKR WAM MCHC: 33 g/dL (ref 31.0–36.0)
BKR WAM MCV: 91.9 fL (ref 80.0–100.0)
BKR WAM MONOCYTE ABSOLUTE COUNT.: 0.47 x 1000/ÂµL (ref 0.00–1.00)
BKR WAM MONOCYTES: 9.3 % (ref 4.0–12.0)
BKR WAM MPV: 10.8 fL (ref 8.0–12.0)
BKR WAM NEUTROPHILS: 53.6 % (ref 39.0–72.0)
BKR WAM NUCLEATED RED BLOOD CELLS: 0 % (ref 0.0–1.0)
BKR WAM PLATELETS: 218 x1000/ÂµL (ref 150–420)
BKR WAM RDW-CV: 12.2 % (ref 11.0–15.0)
BKR WAM RED BLOOD CELL COUNT.: 4.91 M/ÂµL (ref 4.00–6.00)
BKR WAM WHITE BLOOD CELL COUNT: 5.1 x1000/ÂµL (ref 4.0–11.0)

## 2024-12-25 LAB — TROPONIN T HIGH SENSITIVITY, 1 HOUR WITH REFLEX (BH GH LMW YH)
BKR TROPONIN T HS 1 HOUR DELTA FROM 0 HOUR: 0 ng/L
BKR TROPONIN T HS 1 HOUR: 6 ng/L

## 2024-12-25 LAB — SARS-COV-2 (COVID-19)/INFLUENZA A+B/RSV BY RT-PCR (BH GH LMW YH)
BKR INFLUENZA A: NEGATIVE
BKR INFLUENZA B: NEGATIVE
BKR RESPIRATORY SYNCYTIAL VIRUS: NEGATIVE
BKR SARS-COV-2 RNA (COVID-19) (YH): NEGATIVE

## 2024-12-25 LAB — COMPREHENSIVE METABOLIC PANEL
BKR A/G RATIO: 1.4
BKR ALANINE AMINOTRANSFERASE (ALT): 22 U/L (ref 12–78)
BKR ALBUMIN: 4.2 g/dL (ref 3.4–5.0)
BKR ALKALINE PHOSPHATASE: 64 U/L (ref 20–135)
BKR ANION GAP: 3 (ref 3–18)
BKR ASPARTATE AMINOTRANSFERASE (AST): 21 U/L (ref 5–37)
BKR AST/ALT RATIO: 1
BKR BILIRUBIN TOTAL: 1.2 mg/dL — ABNORMAL HIGH (ref 0.0–1.0)
BKR BLOOD UREA NITROGEN: 11 mg/dL (ref 8–25)
BKR BUN / CREAT RATIO: 12.4 (ref 8.0–25.0)
BKR CALCIUM: 9.4 mg/dL (ref 8.4–10.3)
BKR CHLORIDE: 108 mmol/L (ref 95–115)
BKR CO2: 30 mmol/L (ref 21–32)
BKR CREATININE: 0.89 mg/dL (ref 0.50–1.30)
BKR EGFR, CREATININE (CKD-EPI 2021): >60 mL/min/1.73m2 (ref >=60)
BKR GLOBULIN: 3 g/dL
BKR GLUCOSE: 95 mg/dL (ref 70–100)
BKR OSMOLALITY CALCULATION: 280 mosm/kg (ref 275–295)
BKR POTASSIUM: 4.9 mmol/L (ref 3.5–5.1)
BKR PROTEIN TOTAL: 7.2 g/dL (ref 6.4–8.2)
BKR SODIUM: 141 mmol/L (ref 136–145)

## 2024-12-25 LAB — URINALYSIS WITH CULTURE REFLEX      (BH LMW YH)
BKR BILIRUBIN, UA: NEGATIVE
BKR BLOOD, UA: NEGATIVE
BKR GLUCOSE, UA: NEGATIVE
BKR KETONES, UA: NEGATIVE
BKR LEUKOCYTE ESTERASE, UA: NEGATIVE
BKR NITRITE, UA: NEGATIVE
BKR PH, UA: 6.5 (ref 5.5–7.5)
BKR PROTEIN, UA: NEGATIVE
BKR SPECIFIC GRAVITY, UA: 1.014 (ref 1.005–1.030)
BKR UROBILINOGEN, UA: <2 mg/dL (ref <=2.0)

## 2024-12-25 LAB — UA REFLEX CULTURE

## 2024-12-25 LAB — LIPASE: BKR LIPASE: 28 U/L (ref 13–75)

## 2024-12-25 LAB — TROPONIN T HIGH SENSITIVITY, 0 HOUR BASELINE WITH REFLEX (BH GH LMW YH): BKR TROPONIN T HS 0 HOUR BASELINE: <6 ng/L

## 2024-12-25 MED ORDER — SODIUM CHLORIDE 0.9 % IV BOLUS NEW BAG (DROPS CHARGE)
0.9 | Freq: Once | INTRAVENOUS | Status: CP
Start: 2024-12-25 — End: ?
  Administered 2024-12-25: 11:00:00 0.9 mL/h via INTRAVENOUS

## 2024-12-25 MED ORDER — FAMOTIDINE 20 MG TABLET
20 | ORAL_TABLET | Freq: Two times a day (BID) | ORAL | 1 refills | Status: AC
Start: 2024-12-25 — End: ?

## 2024-12-25 MED ORDER — DOXYCYCLINE HYCLATE 100 MG TABLET
100 | ORAL | Status: AC
Start: 2024-12-25 — End: ?

## 2024-12-25 MED ORDER — ALUMINUM-MAG HYDROXIDE-SIMETHICONE 200 MG-200 MG-20 MG/5 ML ORAL SUSP
200-200-20 | Freq: Once | ORAL | Status: CP
Start: 2024-12-25 — End: ?
  Administered 2024-12-25: 12:00:00 200-200-20 mL via ORAL

## 2024-12-25 MED ORDER — KETOROLAC 30 MG/ML (1 ML) INJECTION SOLUTION
30 | Freq: Once | INTRAVENOUS | Status: CP
Start: 2024-12-25 — End: ?
  Administered 2024-12-25: 13:00:00 30 mL via INTRAVENOUS

## 2024-12-25 MED ORDER — FAMOTIDINE 4 MG/ML IN 0.9% SODIUM CHLORIDE (ADULT)
Freq: Once | INTRAVENOUS | Status: CP
Start: 2024-12-25 — End: ?
  Administered 2024-12-25: 12:00:00 10.000 mL via INTRAVENOUS

## 2024-12-25 MED ORDER — NAPROXEN 500 MG TABLET
500 | ORAL_TABLET | Freq: Two times a day (BID) | ORAL | 1 refills | Status: AC
Start: 2024-12-25 — End: ?

## 2024-12-25 MED ORDER — ALUMINUM-MAG HYDROXIDE-SIMETHICONE 400 MG-400 MG-40 MG/5 ML ORAL SUSP
400-400-40 | Freq: Three times a day (TID) | ORAL | 1 refills | Status: AC
Start: 2024-12-25 — End: ?

## 2024-12-25 NOTE — ED Notes [6]
 2:20 PM Pt medically cleared for Discharge. Dr. Revonda aware that pt continues to complain of some chest discomfort. Per Dr. Revonda, informed pt to take Naproxen  as prescribed with dinner today. PIV removed. NAD. Vitals stable. Pt safely ambulated off unit.

## 2024-12-25 NOTE — ED Provider Notes [19]
 Emergency Department	Chief Complaint:Chief Complaint Patient presents with  Abdominal Pain  Chest Pain   Ambulatory to ED c/o L sided upper abd pain/CP radiating into upper back x few days. Felt hot this morning. No SOB/cough/fevers/trauma. Took tylenol yesterday w/o relief. NAD upon ED arrival.  YEP:Andrew Holder is a 24 y.o. 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 presenting with left upper quadrant abdominal pain that has been constant since yesterday now associated with left-sided chest pain.  Patient states he originally woke up yesterday morning with this constant left upper quadrant abdominal pain that persisted throughout the day.  States he has had similar shortened bursts of pain in the past, but never consistent.  States that it started to radiate to his back and up into his chest this morning upon wakening.  States it is slightly worse with lying on it, but denies any significant modifying factors otherwise.  Denies any fever, URI symptoms.  Does report he has been recently treated for potential prostatitis which was diagnosed from urinary symptoms he has been having.  States he has been on doxycycline  for almost a month.Limitations to History : NoneHistorian: Patient, father.Past Medical History:Past Medical History[1]Past Surgical History[2]Social History:Social History[3]Family History:History reviewed. No pertinent family history.Medications: Medication List  START taking these medications  aluminum  & magnesium hydroxide-simethicone  400-400-40 mg/5 mL suspensionCommonly known as: MYLANTATake 10 mLs by mouth 3 (three) times daily for 5 days. Do not take more than 12 teaspoonsful in a 24-hour period famotidine  20 mg tabletCommonly known as: PEPCIDTake 1 tablet (20 mg total) by mouth 2 (two) times daily. naproxen  500 mg tabletCommonly known as: NAPROSYNTake 1 tablet (500 mg total) by mouth 2 (two) times daily.  ASK your doctor about these medications  doxycycline  hyclate 100 mg tabletCommonly known as: VIBRA -TABS tamsulosin  0.4 mg 24 hr capsuleCommonly known as: FLOMAXTake 1 capsule (0.4 mg total) by mouth daily.   Where to Get Your Medications  These medications were sent to [id number] - [place], [place] - [address] AT [facility]  [address] [place], [place] [address]  Phone: [phone] aluminum  & magnesium hydroxide-simethicone  400-400-40 mg/5 mL suspensionfamotidine 20 mg tabletnaproxen 500 mg tablet Allergies as of 12/25/2024 - Review Complete 12/25/2024 Allergen Reaction Noted  Peanut  08/28/2012 Physical Exam:  BP 123/64  - Pulse 67  - Temp 98 ?F (36.7 ?C)  - Resp 18  - SpO2 99% 	General Appear:	Awake, alert, in no acute distress, lying in stretcher.Head:			Normocephalic, atraumatic.Ears/Nose/Throat:	Oropharynx patent, mucosal membranes moist.Eyes:			Anicteric. Neck:			Supple.Cardiovascular:	Regular rate and rhythm. Distal pulses intact.Respiratory:		Clear to auscultation bilaterally, no wheezing, rales, rhonchi.Abdomen:		Soft, non-tender, non-distended.Musculoskeletal:	No deformities, edema. Skin:			Dry, no rashes.Neurologic:		Alert and oriented. Moving all extremities.	Pulse oximetry: 99% on room air, adequate oxygenation.Medical Decision Making:Andrew Holder, 24 y.o., male, w/ h/x of degenerative disc disease, recent prostatitis on 1 month of doxycycline  p/w atypical left-sided epigastric/chest pain with radiation to the back. Vital signs stable upon arrival. Exam without focal tenderness. EKG without signs of active ischemia. Given the timing of pain onset and ER presentation, troponin ordered for evaluation of ACS. Presentation not consistent with acute PE as per [facility] criteria. Pneumothorax unlikely based on exam, but will rule-out with chest X-ray. Thoracic aortic dissection and tamponade unlikely based on presenting symptoms and exam. Pericarditis, myocarditis, and pneumonia unlikely given lack of infectious symptoms, but will confirm with chest X-ray and labs.  Abdominal pain more likely gastritis related, especially with the prolonged  use of doxycycline .  HEART score pending troponin level. Cardiology consultation not needed at this time. Will re-assess after initial labs and imaging results. Previous medical records reviewed within [facility] including previous outpatient evaluation on 11/24/2024.Nursing notes from current visit reviewed. EKG: Normal sinus rhythm at 71 BPM, normal axis, normal intervals, no acute ischemia. Interpreted by myself, Bernardino Levans, MD.Previous EKG:  No previous EKG.Laboratory studies: Results for orders placed or performed during the hospital encounter of 12/25/24 Lipase Result Value Ref Range  Lipase 28 13 - 75 U/L Troponin T High Sensitivity, Emergency; 0 hour baseline AND 1 hour with reflex (3 hour) Result Value Ref Range  High Sensitivity Troponin T <6 See Comment ng/L CBC auto differential Result Value Ref Range  WBC 5.1 4.0 - 11.0 x1000/?L  RBC 4.91 4.00 - 6.00 M/?L  Hemoglobin 14.9 13.2 - 17.1 g/dL  Hematocrit 54.89 61.49 - 50.00 %  MCV 91.9 80.0 - 100.0 fL  MCH 30.3 27.0 - 33.0 pg  MCHC 33.0 31.0 - 36.0 g/dL  RDW-CV 87.7 88.9 - 84.9 %  Platelets 218 150 - 420 x1000/?L  MPV 10.8 8.0 - 12.0 fL  Neutrophils 53.6 39.0 - 72.0 %  Lymphocytes 34.5 17.0 - 50.0 %  Monocytes 9.3 4.0 - 12.0 %  Eosinophils 1.6 0.0 - 5.0 %  Basophil 0.8 0.0 - 1.4 %  Immature Granulocytes 0.2 0.0 - 1.0 %  nRBC 0.0 0.0 - 1.0 %  Absolute Lymphocyte Count 1.74 0.60 - 3.70 x 1000/?L  Monocyte Absolute Count 0.47 0.00 - 1.00 x 1000/?L  Eosinophil Absolute Count 0.08 0.00 - 1.00 x 1000/?L  Basophil Absolute Count 0.04 0.00 - 1.00 x 1000/?L  Absolute Immature Granulocyte Count 0.01 0.00 - 0.30 x 1000/?L  Absolute nRBC 0.00 0.00 - 1.00 x 1000/?L  ANC (Abs Neutrophil Count) 2.71 2.00 - 7.60 x 1000/?L Comprehensive metabolic panel Result Value Ref Range  Sodium 141 136 - 145 mmol/L  Potassium 4.9 3.5 - 5.1 mmol/L  Chloride 108 95 - 115 mmol/L  CO2 30 21 - 32 mmol/L  Anion Gap 3 3 - 18  Glucose 95 70 - 100 mg/dL  BUN 11 8 - 25 mg/dL  Creatinine 9.10 9.49 - 1.30 mg/dL  Calcium 9.4 8.4 - 89.6 mg/dL  BUN/Creatinine Ratio 87.5 8.0 - 25.0  Total Protein 7.2 6.4 - 8.2 g/dL  Albumin 4.2 3.4 - 5.0 g/dL  Total Bilirubin 1.2 (H) 0.0 - 1.0 mg/dL  Alkaline Phosphatase 64 20 - 135 U/L  Alanine Aminotransferase (ALT) 22 12 - 78 U/L  Aspartate Aminotransferase (AST) 21 5 - 37 U/L  Globulin 3.0 g/dL  A/G Ratio 1.4   AST/ALT Ratio 1.0 Reference Range Not Established  Osmolality Calculation 280 275 - 295 mOsm/kg  eGFR (Creatinine) >60 >=60 mL/min/1.6m2  Creatinine Delta   Urinalysis with culture reflex     ([facility])  Specimen: Urine Result Value Ref Range  Clarity, UA Clear Clear  Color, UA Colorless Yellow, Colorless  Specific Gravity, UA 1.014 1.005 - 1.030  pH, UA 6.5 5.5 - 7.5  Protein, UA Negative Negative, Trace  Glucose, UA Negative Negative  Ketones, UA Negative Negative  Blood, UA Negative Negative  Bilirubin, UA Negative Negative  Leukocytes, UA Negative Negative  Nitrite, UA Negative Negative  Urobilinogen, UA <2.0 <=2.0 mg/dL  [employer] Received ? Yes  Troponin T High Sensitivity, 1 Hour With Reflex ([facility]) Result Value Ref Range  High Sensitivity Troponin T 6 See Comment ng/L  1 hour Delta from 0 Hour, HS-Troponin T 0 ng/L EKG Result Value Ref Range  Heart Rate 71 bpm  QRS Interval 90 ms  QT Interval 380 ms  QTC Interval 412 ms  P Axis 53 deg  QRS Axis 83 deg  T Wave Axis 54 deg  P-R Interval 152 msec SEVERITY Normal ECG severity Radiologic Imaging studies: CXR Final Result 1. Bilateral peribronchial cuffing consistent with reactive airway disease. 2. No consolidation/focal pneumonia.  Reported and signed by:  Glendia Archer, MD   Initial labs reviewed and interpreted by myself.  Initial labs returning without any significant abnormalities.  Troponin negative.  Urinalysis also negative.Imaging reviewed, as read by Radiology.  X-ray returning with signs of peribronchial cuffing consistent with reactive airway disease.Upon re-evaluation, patient reports persistent left-sided chest pain.  The findings above make this very likely to be acute costochondritis that is virally mediated.  Ordered for flu/COVID/RSV.  Also ordered for Toradol  for pain.  If repeat troponin is negative, patient is stable for outpatient treatment for both the pill gastritis and costochondritis.Troponin negative.  No indication for further testing or treatment within the emergency department.  Still awaiting viral testing, if positive, we will reach out to patient for additional treatment.  We will discharge home with famotidine , naproxen , Maalox as needed.ED Course/Assessment/Plan:Medical Decision MakingAmount and/or Complexity of Data ReviewedLabs: ordered.Radiology: ordered.RiskOTC drugs.Prescription drug management.Discussion of Management with other Physicians or Appropriate Source: None My Independent Interpretation of Studies: ECG, as above.Admission/Observation was considered: No______________________________________________________________________Condition: 	Stable.Disposition: 	Discharge home.Diagnosis: 	Acute costochondritis, viral syndrome, pill gastritis.  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

## 2024-12-26 LAB — NEISSERIA GONORRHEA, NAAT (LAB ORDER ONLY)   (BH GH L LMW YH): BKR NEISSERIA GONORRHOEAE, DNA PROBE: NEGATIVE

## 2024-12-26 LAB — CHLAMYDIA TRACHOMATIS, NAAT (LAB ORDER ONLY) (BH GH L LMW YH): BKR CHLAMYDIA, DNA PROBE: NEGATIVE

## 2024-12-26 NOTE — Telephone Communication [[id number]]
 F/u call placed.D/c inst. Reviewed.F/u advised.To return if without further improvement or if worsens.
